# Patient Record
Sex: Female | Born: 1940 | Race: White | Hispanic: No | Marital: Married | Smoking: Never smoker
Health system: Southern US, Community
[De-identification: ages and names within clinical notes are randomized; demographics above are authoritative.]

## PROBLEM LIST (undated history)

## (undated) DIAGNOSIS — E785 Hyperlipidemia, unspecified: Secondary | ICD-10-CM

## (undated) DIAGNOSIS — H539 Unspecified visual disturbance: Secondary | ICD-10-CM

## (undated) DIAGNOSIS — I10 Essential (primary) hypertension: Secondary | ICD-10-CM

## (undated) HISTORY — PX: MASTECTOMY: SHX3

## (undated) HISTORY — PX: MENISCUS REPAIR: SHX5179

## (undated) HISTORY — DX: Hyperlipidemia, unspecified: E78.5

## (undated) HISTORY — DX: Essential (primary) hypertension: I10

## (undated) HISTORY — PX: PARTIAL HYSTERECTOMY: SHX80

## (undated) HISTORY — DX: Unspecified visual disturbance: H53.9

---

## 1998-07-26 ENCOUNTER — Emergency Department (HOSPITAL_COMMUNITY): Admission: EM | Admit: 1998-07-26 | Discharge: 1998-07-26 | Payer: Self-pay | Admitting: Emergency Medicine

## 1998-08-16 ENCOUNTER — Encounter: Payer: Self-pay | Admitting: Internal Medicine

## 1998-08-16 ENCOUNTER — Ambulatory Visit (HOSPITAL_COMMUNITY): Admission: RE | Admit: 1998-08-16 | Discharge: 1998-08-16 | Payer: Self-pay | Admitting: Internal Medicine

## 1999-09-28 ENCOUNTER — Ambulatory Visit (HOSPITAL_COMMUNITY): Admission: RE | Admit: 1999-09-28 | Discharge: 1999-09-28 | Payer: Self-pay | Admitting: *Deleted

## 1999-09-28 ENCOUNTER — Encounter (INDEPENDENT_AMBULATORY_CARE_PROVIDER_SITE_OTHER): Payer: Self-pay | Admitting: Specialist

## 2002-01-08 ENCOUNTER — Encounter: Admission: RE | Admit: 2002-01-08 | Discharge: 2002-01-08 | Payer: Self-pay | Admitting: Internal Medicine

## 2002-01-08 ENCOUNTER — Encounter: Payer: Self-pay | Admitting: Internal Medicine

## 2003-03-06 ENCOUNTER — Encounter: Admission: RE | Admit: 2003-03-06 | Discharge: 2003-03-06 | Payer: Self-pay | Admitting: Internal Medicine

## 2003-03-06 ENCOUNTER — Encounter: Payer: Self-pay | Admitting: Internal Medicine

## 2006-12-25 ENCOUNTER — Encounter: Admission: RE | Admit: 2006-12-25 | Discharge: 2006-12-25 | Payer: Self-pay | Admitting: Internal Medicine

## 2007-03-20 ENCOUNTER — Encounter (INDEPENDENT_AMBULATORY_CARE_PROVIDER_SITE_OTHER): Payer: Self-pay | Admitting: Gastroenterology

## 2007-03-20 ENCOUNTER — Ambulatory Visit (HOSPITAL_COMMUNITY): Admission: RE | Admit: 2007-03-20 | Discharge: 2007-03-20 | Payer: Self-pay | Admitting: Gastroenterology

## 2011-01-03 NOTE — Op Note (Signed)
NAMEMONICE, LUNDY                ACCOUNT NO.:  [id number]   MEDICAL RECORD NO.:  [id number]          PATIENT TYPE:  AMB   LOCATION:  ENDO                         FACILITY:  [facility]   PHYSICIAN:  Anselmo Rod, M.D.  DATE OF BIRTH:  1941-05-29   DATE OF PROCEDURE:  03/20/2007  DATE OF DISCHARGE:                               OPERATIVE REPORT   PROCEDURE PERFORMED:  Esophagogastroduodenoscopy with multiple cold  biopsies.   ENDOSCOPIST:  Anselmo Rod, M.D.   INSTRUMENT USED:  Pentax video panendoscope.   INDICATIONS FOR PROCEDURE:  70 year old white female with a history of  abnormal gas and bloating which has since resolved undergoing EGD for  abnormal CT scan of the abdomen showing antral thickening, rule out  ulcer disease versus masses, etc.   PREPROCEDURE PREPARATION:  Informed consent was procured from the  patient.  The patient fasted for 8 hours prior to procedure.  The risks  and benefits of the procedure were discussed with the patient in detail.   PREPROCEDURE PHYSICAL:  The patient had stable vital signs.  Neck  supple.  Chest clear to auscultation.  S1 and S2 regular.  Abdomen soft  with normal bowel sounds.   DESCRIPTION OF PROCEDURE:  The patient was placed in the left lateral  decubitus position, sedated with 75 mcg of Fentanyl and 6 mg of Versed  given intravenously in slow incremental doses. Once the patient was  adequately sedated and maintained on low flow oxygen and continuous  cardiac monitoring, the Pentax video panendoscope was advanced through  the mouthpiece over the tongue into the esophagus under direct vision.  The esophagus was widely patent with no evidence of ring, stricture,  mass, esophagitis or Barrett's mucosa. The scope was then advanced into  the stomach.  Patchy gastritis with old heme was noted in the stomach.  Biopsies were done to rule out the presence of H. pylori by pathology. A  small sessile polyp was seen in th proximal stomach  and was biopsied x 1  for pathology. The proximal small bowel appeared normal. There was no  obstruction. The patient tolerated the procedure well without  complications.   IMPRESSION:  1. Normal appearing esophagus.  2. Patchy gastritis with old heme in the stomach, biopsies done for H.      pylori.  3. No evidence of a hiatal hernia on high retroflexion.  4. Normal appearing antrum except for mild gastritis.  5. Normal proximal small bowel.  6. One small sessile polyp in proximal stomach [biopsied x 1]   RECOMMENDATIONS:  1. Await pathology results.  2. Avoid nonsteroidals.  3. PPI of choice.  4. Outpatient follow-up in the next two weeks for further      recommendations.      Anselmo Rod, M.D.  Electronically Signed     JNM/MEDQ  D:  03/20/2007  T:  03/20/2007  Job:  [id number]   cc:   Georgianne Fick, M.D.  Fax: [phone]

## 2012-02-16 ENCOUNTER — Other Ambulatory Visit: Payer: Self-pay | Admitting: Internal Medicine

## 2012-02-26 ENCOUNTER — Ambulatory Visit
Admission: RE | Admit: 2012-02-26 | Discharge: 2012-02-26 | Disposition: A | Discharge status: Home / Self Care | Payer: Medicare Other | Source: Ambulatory Visit | Attending: Internal Medicine | Admitting: Internal Medicine

## 2014-11-02 DIAGNOSIS — M549 Dorsalgia, unspecified: Secondary | ICD-10-CM | POA: Diagnosis not present

## 2014-11-02 DIAGNOSIS — E782 Mixed hyperlipidemia: Secondary | ICD-10-CM | POA: Diagnosis not present

## 2014-11-02 DIAGNOSIS — I10 Essential (primary) hypertension: Secondary | ICD-10-CM | POA: Diagnosis not present

## 2014-11-02 DIAGNOSIS — Z0000 Encounter for general adult medical examination without abnormal findings: Secondary | ICD-10-CM | POA: Diagnosis not present

## 2014-11-02 DIAGNOSIS — Z1389 Encounter for screening for other disorder: Secondary | ICD-10-CM | POA: Diagnosis not present

## 2014-11-09 DIAGNOSIS — I10 Essential (primary) hypertension: Secondary | ICD-10-CM | POA: Diagnosis not present

## 2014-11-09 DIAGNOSIS — M79671 Pain in right foot: Secondary | ICD-10-CM | POA: Diagnosis not present

## 2014-11-09 DIAGNOSIS — Z0000 Encounter for general adult medical examination without abnormal findings: Secondary | ICD-10-CM | POA: Diagnosis not present

## 2014-11-09 DIAGNOSIS — E782 Mixed hyperlipidemia: Secondary | ICD-10-CM | POA: Diagnosis not present

## 2014-11-17 DIAGNOSIS — M76821 Posterior tibial tendinitis, right leg: Secondary | ICD-10-CM | POA: Diagnosis not present

## 2014-11-17 DIAGNOSIS — M2041 Other hammer toe(s) (acquired), right foot: Secondary | ICD-10-CM | POA: Diagnosis not present

## 2014-11-17 DIAGNOSIS — M2042 Other hammer toe(s) (acquired), left foot: Secondary | ICD-10-CM | POA: Diagnosis not present

## 2014-12-17 DIAGNOSIS — M25571 Pain in right ankle and joints of right foot: Secondary | ICD-10-CM | POA: Diagnosis not present

## 2014-12-17 DIAGNOSIS — M76821 Posterior tibial tendinitis, right leg: Secondary | ICD-10-CM | POA: Diagnosis not present

## 2014-12-17 DIAGNOSIS — M722 Plantar fascial fibromatosis: Secondary | ICD-10-CM | POA: Diagnosis not present

## 2014-12-31 DIAGNOSIS — M722 Plantar fascial fibromatosis: Secondary | ICD-10-CM | POA: Diagnosis not present

## 2014-12-31 DIAGNOSIS — M76821 Posterior tibial tendinitis, right leg: Secondary | ICD-10-CM | POA: Diagnosis not present

## 2015-01-07 DIAGNOSIS — Z803 Family history of malignant neoplasm of breast: Secondary | ICD-10-CM | POA: Diagnosis not present

## 2015-01-07 DIAGNOSIS — Z1231 Encounter for screening mammogram for malignant neoplasm of breast: Secondary | ICD-10-CM | POA: Diagnosis not present

## 2015-01-25 DIAGNOSIS — R921 Mammographic calcification found on diagnostic imaging of breast: Secondary | ICD-10-CM | POA: Diagnosis not present

## 2015-01-28 DIAGNOSIS — Z803 Family history of malignant neoplasm of breast: Secondary | ICD-10-CM | POA: Diagnosis not present

## 2015-01-28 DIAGNOSIS — R920 Mammographic microcalcification found on diagnostic imaging of breast: Secondary | ICD-10-CM | POA: Diagnosis not present

## 2015-03-24 ENCOUNTER — Ambulatory Visit
Admission: RE | Admit: 2015-03-24 | Discharge: 2015-03-24 | Disposition: A | Discharge status: Home / Self Care | Payer: Medicare Other | Source: Ambulatory Visit | Attending: Internal Medicine | Admitting: Internal Medicine

## 2015-03-24 ENCOUNTER — Other Ambulatory Visit: Payer: Self-pay | Admitting: Internal Medicine

## 2015-03-24 DIAGNOSIS — R5383 Other fatigue: Secondary | ICD-10-CM | POA: Diagnosis not present

## 2015-03-24 DIAGNOSIS — R0602 Shortness of breath: Secondary | ICD-10-CM | POA: Diagnosis not present

## 2015-03-24 DIAGNOSIS — H539 Unspecified visual disturbance: Secondary | ICD-10-CM | POA: Diagnosis not present

## 2015-03-24 DIAGNOSIS — H8109 Meniere's disease, unspecified ear: Secondary | ICD-10-CM | POA: Diagnosis not present

## 2015-03-24 DIAGNOSIS — H53123 Transient visual loss, bilateral: Secondary | ICD-10-CM | POA: Diagnosis not present

## 2015-03-24 DIAGNOSIS — H538 Other visual disturbances: Secondary | ICD-10-CM | POA: Diagnosis not present

## 2015-03-25 ENCOUNTER — Other Ambulatory Visit: Payer: Self-pay | Admitting: Internal Medicine

## 2015-03-26 ENCOUNTER — Other Ambulatory Visit: Payer: Self-pay

## 2015-04-07 DIAGNOSIS — I10 Essential (primary) hypertension: Secondary | ICD-10-CM | POA: Diagnosis not present

## 2015-04-07 DIAGNOSIS — E782 Mixed hyperlipidemia: Secondary | ICD-10-CM | POA: Diagnosis not present

## 2015-04-07 DIAGNOSIS — H53123 Transient visual loss, bilateral: Secondary | ICD-10-CM | POA: Diagnosis not present

## 2015-04-13 DIAGNOSIS — R002 Palpitations: Secondary | ICD-10-CM | POA: Diagnosis not present

## 2015-04-13 DIAGNOSIS — R0602 Shortness of breath: Secondary | ICD-10-CM | POA: Diagnosis not present

## 2015-04-27 DIAGNOSIS — I10 Essential (primary) hypertension: Secondary | ICD-10-CM | POA: Diagnosis not present

## 2015-04-27 DIAGNOSIS — E782 Mixed hyperlipidemia: Secondary | ICD-10-CM | POA: Diagnosis not present

## 2015-04-27 DIAGNOSIS — E785 Hyperlipidemia, unspecified: Secondary | ICD-10-CM | POA: Diagnosis not present

## 2015-04-27 DIAGNOSIS — M5137 Other intervertebral disc degeneration, lumbosacral region: Secondary | ICD-10-CM | POA: Diagnosis not present

## 2015-05-04 DIAGNOSIS — I341 Nonrheumatic mitral (valve) prolapse: Secondary | ICD-10-CM | POA: Diagnosis not present

## 2015-05-04 DIAGNOSIS — R600 Localized edema: Secondary | ICD-10-CM | POA: Diagnosis not present

## 2015-05-04 DIAGNOSIS — E782 Mixed hyperlipidemia: Secondary | ICD-10-CM | POA: Diagnosis not present

## 2015-05-04 DIAGNOSIS — Z23 Encounter for immunization: Secondary | ICD-10-CM | POA: Diagnosis not present

## 2015-05-04 DIAGNOSIS — R42 Dizziness and giddiness: Secondary | ICD-10-CM | POA: Diagnosis not present

## 2015-06-25 DIAGNOSIS — J0100 Acute maxillary sinusitis, unspecified: Secondary | ICD-10-CM | POA: Diagnosis not present

## 2015-10-07 DIAGNOSIS — N819 Female genital prolapse, unspecified: Secondary | ICD-10-CM | POA: Diagnosis not present

## 2015-10-11 DIAGNOSIS — N812 Incomplete uterovaginal prolapse: Secondary | ICD-10-CM | POA: Diagnosis not present

## 2015-10-22 DIAGNOSIS — N812 Incomplete uterovaginal prolapse: Secondary | ICD-10-CM | POA: Diagnosis not present

## 2015-10-22 DIAGNOSIS — N83292 Other ovarian cyst, left side: Secondary | ICD-10-CM | POA: Diagnosis not present

## 2015-10-22 DIAGNOSIS — N83202 Unspecified ovarian cyst, left side: Secondary | ICD-10-CM | POA: Diagnosis not present

## 2015-11-17 DIAGNOSIS — E782 Mixed hyperlipidemia: Secondary | ICD-10-CM | POA: Diagnosis not present

## 2015-11-17 DIAGNOSIS — I10 Essential (primary) hypertension: Secondary | ICD-10-CM | POA: Diagnosis not present

## 2015-11-17 DIAGNOSIS — R5382 Chronic fatigue, unspecified: Secondary | ICD-10-CM | POA: Diagnosis not present

## 2015-11-17 DIAGNOSIS — I341 Nonrheumatic mitral (valve) prolapse: Secondary | ICD-10-CM | POA: Diagnosis not present

## 2015-11-17 DIAGNOSIS — Z0000 Encounter for general adult medical examination without abnormal findings: Secondary | ICD-10-CM | POA: Diagnosis not present

## 2015-11-23 DIAGNOSIS — I10 Essential (primary) hypertension: Secondary | ICD-10-CM | POA: Diagnosis not present

## 2015-11-23 DIAGNOSIS — H8109 Meniere's disease, unspecified ear: Secondary | ICD-10-CM | POA: Diagnosis not present

## 2015-11-23 DIAGNOSIS — E782 Mixed hyperlipidemia: Secondary | ICD-10-CM | POA: Diagnosis not present

## 2015-11-23 DIAGNOSIS — R600 Localized edema: Secondary | ICD-10-CM | POA: Diagnosis not present

## 2015-12-13 DIAGNOSIS — Z961 Presence of intraocular lens: Secondary | ICD-10-CM | POA: Diagnosis not present

## 2015-12-13 DIAGNOSIS — H04123 Dry eye syndrome of bilateral lacrimal glands: Secondary | ICD-10-CM | POA: Diagnosis not present

## 2015-12-13 DIAGNOSIS — H43393 Other vitreous opacities, bilateral: Secondary | ICD-10-CM | POA: Diagnosis not present

## 2016-01-31 DIAGNOSIS — N898 Other specified noninflammatory disorders of vagina: Secondary | ICD-10-CM | POA: Diagnosis not present

## 2016-01-31 DIAGNOSIS — N83209 Unspecified ovarian cyst, unspecified side: Secondary | ICD-10-CM | POA: Diagnosis not present

## 2016-01-31 DIAGNOSIS — Z960 Presence of urogenital implants: Secondary | ICD-10-CM | POA: Diagnosis not present

## 2016-02-14 ENCOUNTER — Other Ambulatory Visit: Payer: Self-pay

## 2016-02-14 DIAGNOSIS — L570 Actinic keratosis: Secondary | ICD-10-CM | POA: Diagnosis not present

## 2016-02-14 DIAGNOSIS — C44519 Basal cell carcinoma of skin of other part of trunk: Secondary | ICD-10-CM | POA: Diagnosis not present

## 2016-02-14 DIAGNOSIS — C44511 Basal cell carcinoma of skin of breast: Secondary | ICD-10-CM | POA: Diagnosis not present

## 2016-02-14 DIAGNOSIS — D485 Neoplasm of uncertain behavior of skin: Secondary | ICD-10-CM | POA: Diagnosis not present

## 2016-02-14 DIAGNOSIS — L739 Follicular disorder, unspecified: Secondary | ICD-10-CM | POA: Diagnosis not present

## 2016-02-21 DIAGNOSIS — J329 Chronic sinusitis, unspecified: Secondary | ICD-10-CM | POA: Diagnosis not present

## 2016-04-13 DIAGNOSIS — C44519 Basal cell carcinoma of skin of other part of trunk: Secondary | ICD-10-CM | POA: Diagnosis not present

## 2016-05-31 DIAGNOSIS — E782 Mixed hyperlipidemia: Secondary | ICD-10-CM | POA: Diagnosis not present

## 2016-05-31 DIAGNOSIS — R600 Localized edema: Secondary | ICD-10-CM | POA: Diagnosis not present

## 2016-05-31 DIAGNOSIS — I10 Essential (primary) hypertension: Secondary | ICD-10-CM | POA: Diagnosis not present

## 2016-05-31 DIAGNOSIS — Z4689 Encounter for fitting and adjustment of other specified devices: Secondary | ICD-10-CM | POA: Diagnosis not present

## 2016-06-07 DIAGNOSIS — E782 Mixed hyperlipidemia: Secondary | ICD-10-CM | POA: Diagnosis not present

## 2016-06-07 DIAGNOSIS — I10 Essential (primary) hypertension: Secondary | ICD-10-CM | POA: Diagnosis not present

## 2016-06-07 DIAGNOSIS — M545 Low back pain: Secondary | ICD-10-CM | POA: Diagnosis not present

## 2016-06-07 DIAGNOSIS — H8109 Meniere's disease, unspecified ear: Secondary | ICD-10-CM | POA: Diagnosis not present

## 2016-06-07 DIAGNOSIS — Z23 Encounter for immunization: Secondary | ICD-10-CM | POA: Diagnosis not present

## 2016-09-11 DIAGNOSIS — N8110 Cystocele, unspecified: Secondary | ICD-10-CM | POA: Diagnosis not present

## 2016-09-11 DIAGNOSIS — E782 Mixed hyperlipidemia: Secondary | ICD-10-CM | POA: Diagnosis not present

## 2016-09-14 DIAGNOSIS — E782 Mixed hyperlipidemia: Secondary | ICD-10-CM | POA: Diagnosis not present

## 2016-09-14 DIAGNOSIS — I10 Essential (primary) hypertension: Secondary | ICD-10-CM | POA: Diagnosis not present

## 2016-09-14 DIAGNOSIS — R0602 Shortness of breath: Secondary | ICD-10-CM | POA: Diagnosis not present

## 2016-09-14 DIAGNOSIS — R072 Precordial pain: Secondary | ICD-10-CM | POA: Diagnosis not present

## 2016-09-20 DIAGNOSIS — R0609 Other forms of dyspnea: Secondary | ICD-10-CM | POA: Diagnosis not present

## 2016-09-20 DIAGNOSIS — R0989 Other specified symptoms and signs involving the circulatory and respiratory systems: Secondary | ICD-10-CM | POA: Diagnosis not present

## 2016-09-20 DIAGNOSIS — E782 Mixed hyperlipidemia: Secondary | ICD-10-CM | POA: Diagnosis not present

## 2016-09-20 DIAGNOSIS — R0789 Other chest pain: Secondary | ICD-10-CM | POA: Diagnosis not present

## 2016-09-25 DIAGNOSIS — I10 Essential (primary) hypertension: Secondary | ICD-10-CM | POA: Diagnosis not present

## 2016-09-25 DIAGNOSIS — R0789 Other chest pain: Secondary | ICD-10-CM | POA: Diagnosis not present

## 2016-09-25 DIAGNOSIS — R0602 Shortness of breath: Secondary | ICD-10-CM | POA: Diagnosis not present

## 2016-09-27 DIAGNOSIS — R0989 Other specified symptoms and signs involving the circulatory and respiratory systems: Secondary | ICD-10-CM | POA: Diagnosis not present

## 2016-09-27 DIAGNOSIS — R0602 Shortness of breath: Secondary | ICD-10-CM | POA: Diagnosis not present

## 2016-09-27 DIAGNOSIS — R0789 Other chest pain: Secondary | ICD-10-CM | POA: Diagnosis not present

## 2016-10-11 DIAGNOSIS — R0609 Other forms of dyspnea: Secondary | ICD-10-CM | POA: Diagnosis not present

## 2016-10-11 DIAGNOSIS — E782 Mixed hyperlipidemia: Secondary | ICD-10-CM | POA: Diagnosis not present

## 2016-10-11 DIAGNOSIS — R0789 Other chest pain: Secondary | ICD-10-CM | POA: Diagnosis not present

## 2016-10-11 DIAGNOSIS — I341 Nonrheumatic mitral (valve) prolapse: Secondary | ICD-10-CM | POA: Diagnosis not present

## 2016-10-27 DIAGNOSIS — M85671 Other cyst of bone, right ankle and foot: Secondary | ICD-10-CM | POA: Diagnosis not present

## 2016-10-27 DIAGNOSIS — M79671 Pain in right foot: Secondary | ICD-10-CM | POA: Diagnosis not present

## 2016-10-30 ENCOUNTER — Other Ambulatory Visit: Payer: Self-pay | Admitting: Podiatry

## 2016-10-30 DIAGNOSIS — M8560 Other cyst of bone, unspecified site: Secondary | ICD-10-CM

## 2016-11-05 ENCOUNTER — Ambulatory Visit
Admission: RE | Admit: 2016-11-05 | Discharge: 2016-11-05 | Disposition: A | Discharge status: Home / Self Care | Payer: Medicare Other | Source: Ambulatory Visit | Attending: Podiatry | Admitting: Podiatry

## 2016-11-05 DIAGNOSIS — M7989 Other specified soft tissue disorders: Secondary | ICD-10-CM | POA: Diagnosis not present

## 2016-11-05 DIAGNOSIS — M8560 Other cyst of bone, unspecified site: Secondary | ICD-10-CM

## 2016-11-05 MED ORDER — GADOBENATE DIMEGLUMINE 529 MG/ML IV SOLN
15.0000 mL | Freq: Once | INTRAVENOUS | Status: AC | PRN
  Administered 2016-11-05: 15 mL via INTRAVENOUS

## 2016-11-09 DIAGNOSIS — I10 Essential (primary) hypertension: Secondary | ICD-10-CM | POA: Diagnosis not present

## 2016-11-09 DIAGNOSIS — E782 Mixed hyperlipidemia: Secondary | ICD-10-CM | POA: Diagnosis not present

## 2016-11-13 DIAGNOSIS — M25571 Pain in right ankle and joints of right foot: Secondary | ICD-10-CM | POA: Diagnosis not present

## 2016-11-13 DIAGNOSIS — M79671 Pain in right foot: Secondary | ICD-10-CM | POA: Diagnosis not present

## 2016-11-13 DIAGNOSIS — M85671 Other cyst of bone, right ankle and foot: Secondary | ICD-10-CM | POA: Diagnosis not present

## 2016-11-16 DIAGNOSIS — E782 Mixed hyperlipidemia: Secondary | ICD-10-CM | POA: Diagnosis not present

## 2016-11-16 DIAGNOSIS — I10 Essential (primary) hypertension: Secondary | ICD-10-CM | POA: Diagnosis not present

## 2016-12-19 DIAGNOSIS — M85671 Other cyst of bone, right ankle and foot: Secondary | ICD-10-CM | POA: Diagnosis not present

## 2016-12-19 DIAGNOSIS — M79671 Pain in right foot: Secondary | ICD-10-CM | POA: Diagnosis not present

## 2016-12-25 DIAGNOSIS — H903 Sensorineural hearing loss, bilateral: Secondary | ICD-10-CM | POA: Diagnosis not present

## 2016-12-25 DIAGNOSIS — H9113 Presbycusis, bilateral: Secondary | ICD-10-CM | POA: Diagnosis not present

## 2016-12-25 DIAGNOSIS — R42 Dizziness and giddiness: Secondary | ICD-10-CM | POA: Diagnosis not present

## 2016-12-25 DIAGNOSIS — H8149 Vertigo of central origin, unspecified ear: Secondary | ICD-10-CM | POA: Diagnosis not present

## 2017-01-09 DIAGNOSIS — M79671 Pain in right foot: Secondary | ICD-10-CM | POA: Diagnosis not present

## 2017-01-09 DIAGNOSIS — M85671 Other cyst of bone, right ankle and foot: Secondary | ICD-10-CM | POA: Diagnosis not present

## 2017-03-12 DIAGNOSIS — Z0000 Encounter for general adult medical examination without abnormal findings: Secondary | ICD-10-CM | POA: Diagnosis not present

## 2017-03-12 DIAGNOSIS — I10 Essential (primary) hypertension: Secondary | ICD-10-CM | POA: Diagnosis not present

## 2017-03-12 DIAGNOSIS — E782 Mixed hyperlipidemia: Secondary | ICD-10-CM | POA: Diagnosis not present

## 2017-03-12 DIAGNOSIS — Z780 Asymptomatic menopausal state: Secondary | ICD-10-CM | POA: Diagnosis not present

## 2017-03-19 DIAGNOSIS — I10 Essential (primary) hypertension: Secondary | ICD-10-CM | POA: Diagnosis not present

## 2017-03-19 DIAGNOSIS — E782 Mixed hyperlipidemia: Secondary | ICD-10-CM | POA: Diagnosis not present

## 2017-03-19 DIAGNOSIS — L03119 Cellulitis of unspecified part of limb: Secondary | ICD-10-CM | POA: Diagnosis not present

## 2017-03-19 DIAGNOSIS — R600 Localized edema: Secondary | ICD-10-CM | POA: Diagnosis not present

## 2017-03-19 DIAGNOSIS — Z23 Encounter for immunization: Secondary | ICD-10-CM | POA: Diagnosis not present

## 2017-03-29 DIAGNOSIS — R21 Rash and other nonspecific skin eruption: Secondary | ICD-10-CM | POA: Diagnosis not present

## 2017-03-30 DIAGNOSIS — L989 Disorder of the skin and subcutaneous tissue, unspecified: Secondary | ICD-10-CM | POA: Diagnosis not present

## 2017-03-30 DIAGNOSIS — L309 Dermatitis, unspecified: Secondary | ICD-10-CM | POA: Diagnosis not present

## 2017-04-19 DIAGNOSIS — H52203 Unspecified astigmatism, bilateral: Secondary | ICD-10-CM | POA: Diagnosis not present

## 2017-04-19 DIAGNOSIS — H02831 Dermatochalasis of right upper eyelid: Secondary | ICD-10-CM | POA: Diagnosis not present

## 2017-04-19 DIAGNOSIS — H04123 Dry eye syndrome of bilateral lacrimal glands: Secondary | ICD-10-CM | POA: Diagnosis not present

## 2017-04-19 DIAGNOSIS — H26492 Other secondary cataract, left eye: Secondary | ICD-10-CM | POA: Diagnosis not present

## 2017-05-01 DIAGNOSIS — L03115 Cellulitis of right lower limb: Secondary | ICD-10-CM | POA: Diagnosis not present

## 2017-06-05 DIAGNOSIS — N390 Urinary tract infection, site not specified: Secondary | ICD-10-CM | POA: Diagnosis not present

## 2017-06-07 DIAGNOSIS — Z800 Family history of malignant neoplasm of digestive organs: Secondary | ICD-10-CM | POA: Diagnosis not present

## 2017-06-07 DIAGNOSIS — Z1211 Encounter for screening for malignant neoplasm of colon: Secondary | ICD-10-CM | POA: Diagnosis not present

## 2017-06-11 DIAGNOSIS — K633 Ulcer of intestine: Secondary | ICD-10-CM | POA: Diagnosis not present

## 2017-06-11 DIAGNOSIS — Z800 Family history of malignant neoplasm of digestive organs: Secondary | ICD-10-CM | POA: Diagnosis not present

## 2017-06-11 DIAGNOSIS — K635 Polyp of colon: Secondary | ICD-10-CM | POA: Diagnosis not present

## 2017-06-11 DIAGNOSIS — Z1211 Encounter for screening for malignant neoplasm of colon: Secondary | ICD-10-CM | POA: Diagnosis not present

## 2017-06-25 DIAGNOSIS — Z1231 Encounter for screening mammogram for malignant neoplasm of breast: Secondary | ICD-10-CM | POA: Diagnosis not present

## 2017-06-25 DIAGNOSIS — Z803 Family history of malignant neoplasm of breast: Secondary | ICD-10-CM | POA: Diagnosis not present

## 2017-07-02 DIAGNOSIS — Z23 Encounter for immunization: Secondary | ICD-10-CM | POA: Diagnosis not present

## 2017-07-08 IMAGING — MR MR FOOT*R* WO/W CM
5 of 9 series · 22 of 40 positions shown · IV contrast (9ml multihance)
Comparison: None.

CLINICAL DATA: Pain and swelling in the right foot.

EXAM:
MRI OF THE RIGHT FOREFOOT WITHOUT AND WITH CONTRAST
TECHNIQUE: Multiplanar, multisequence MR imaging of the right forefoot was
performed before and after the administration of intravenous
contrast.
CONTRAST:  15mL MULTIHANCE GADOBENATE DIMEGLUMINE 529 MG/ML IV SOLN
Creatinine was obtained on site at [HOSPITAL] at [HOSPITAL].
Results: Creatinine 1.1 mg/dL.

[Series 3: T2 fat-sat · coronal · 4.0mm · 0.23mm/px · 5 of 27 slices shown (1 of 3)]
[im 1/27]
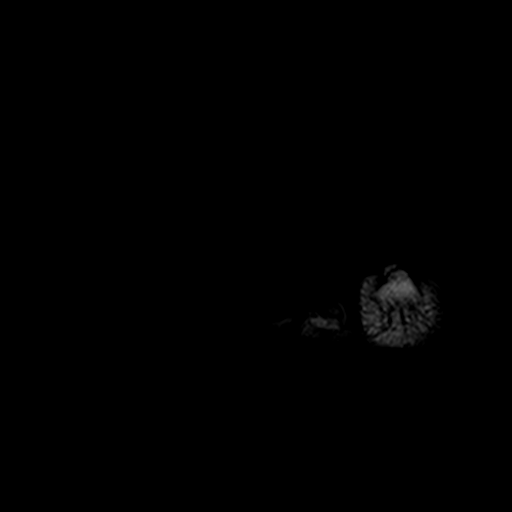
[im 7/27]
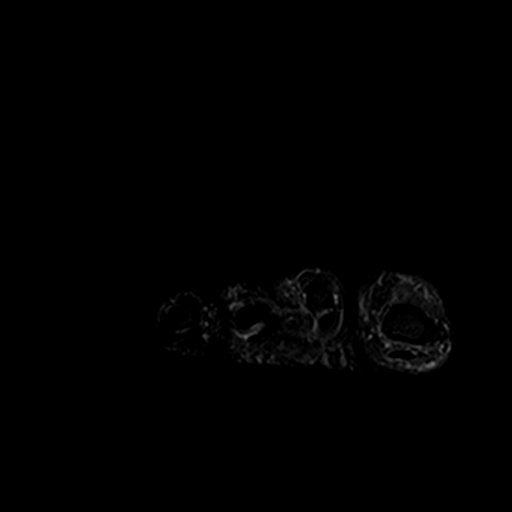
[im 14/27]
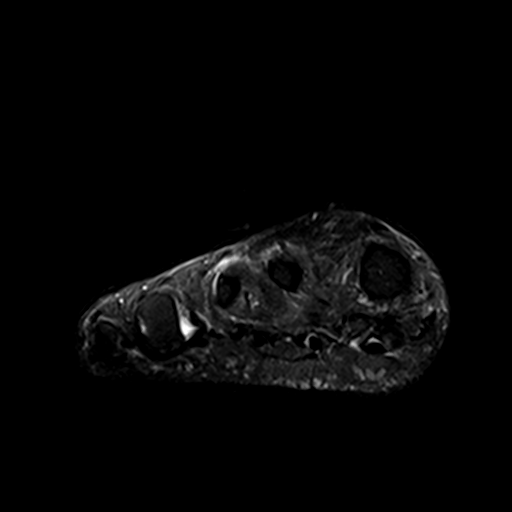
[im 20/27]
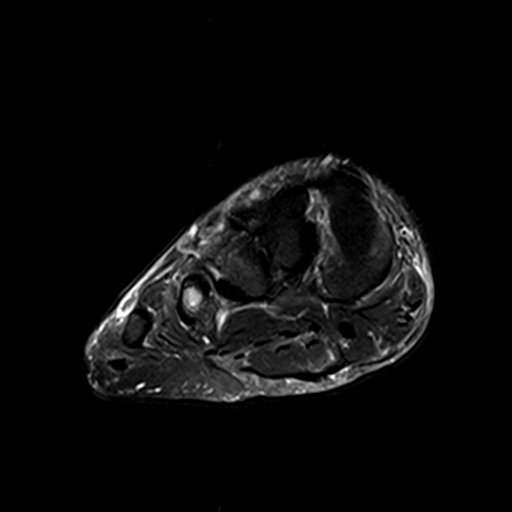
[im 27/27]
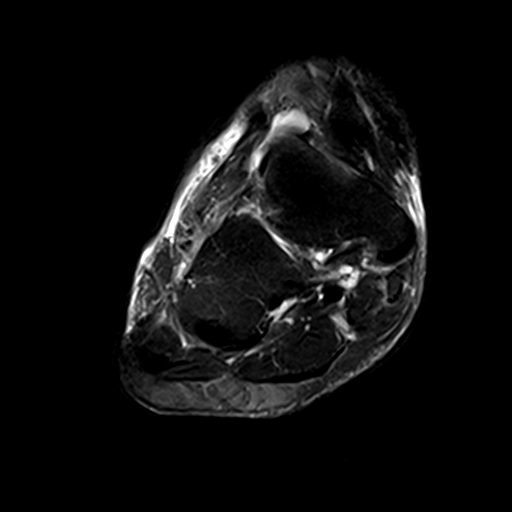

[Series 4: T1 · coronal · 4.0mm · 0.38mm/px · 5 of 27 slices shown]
[im 1/27]
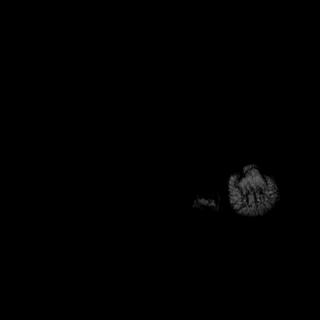
[im 7/27]
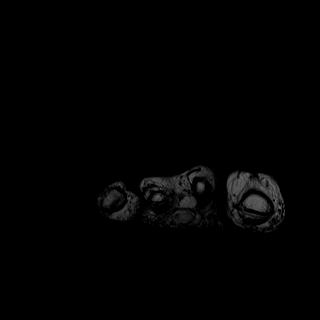
[im 14/27]
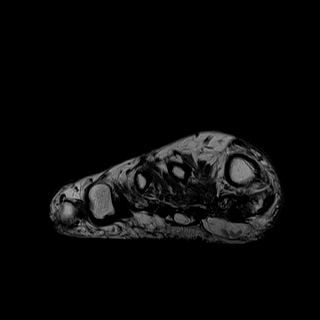
[im 20/27]
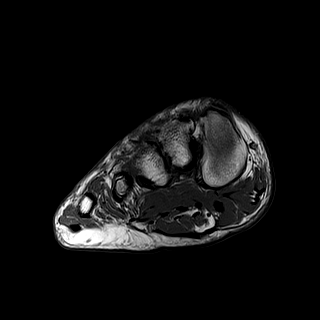
[im 27/27]
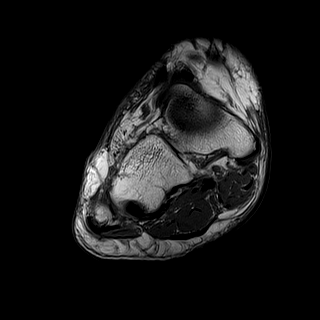

[Series 5: T1 fat-sat · coronal · 4.0mm · 0.47mm/px · 4 of 27 slices shown]
[im 1/27]
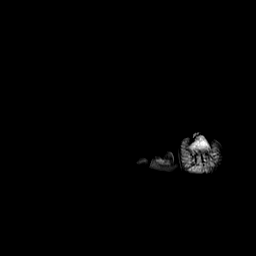
[im 7/27]
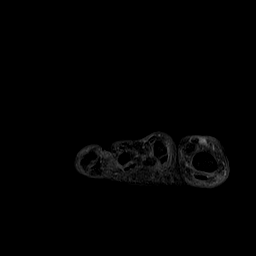
[im 14/27]
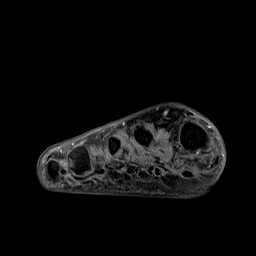
[im 20/27]
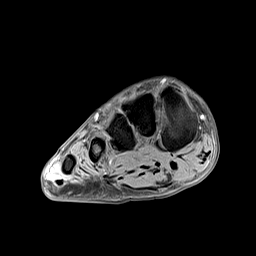

[Series 6: T2 fat-sat · axial · 2.5mm · 0.35mm/px · z∈[-93,-38]mm · 4 of 22 slices shown (2 of 3)]
[im 1/22]
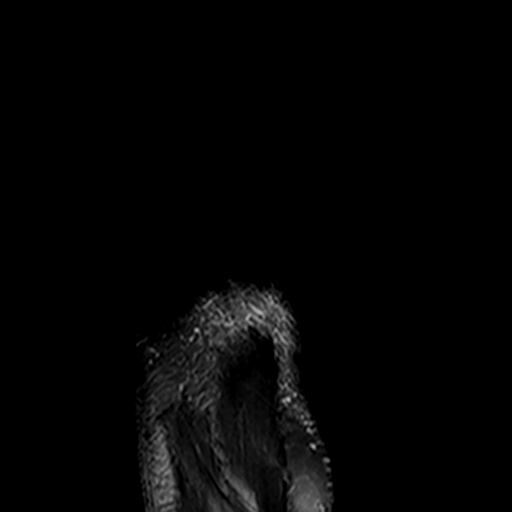
[im 8/22]
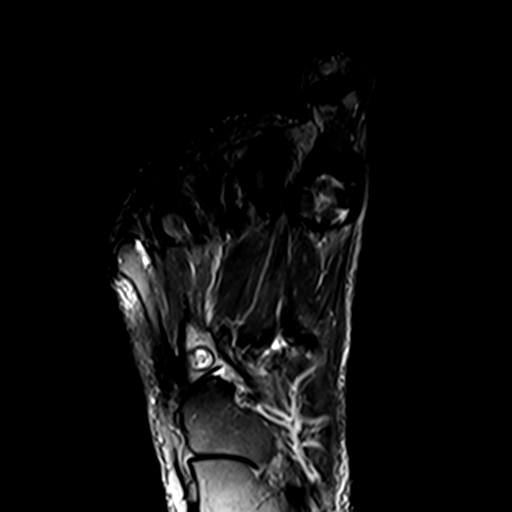
[im 15/22]
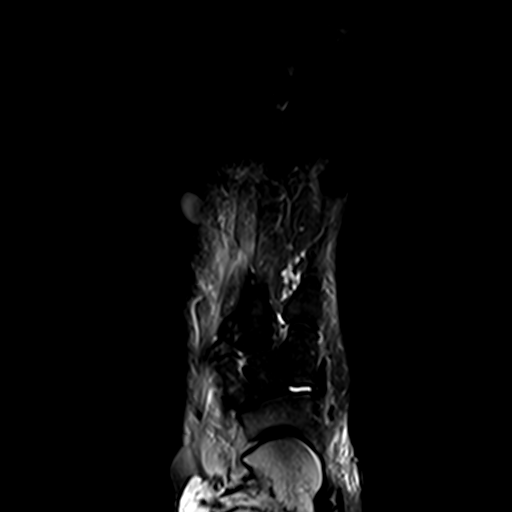
[im 22/22]
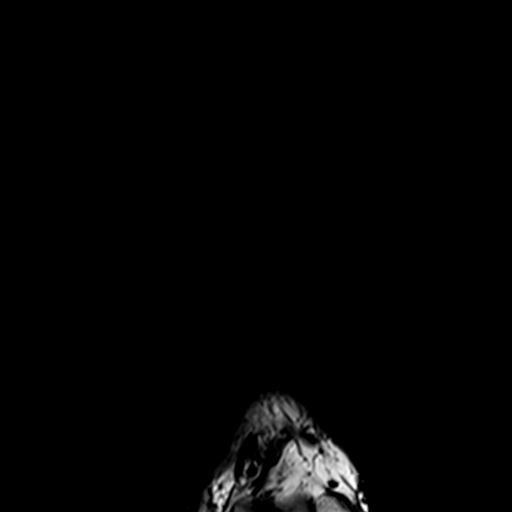

[Series 8: T2 fat-sat · sagittal · 3.0mm · 0.30mm/px · 4 of 25 slices shown (3 of 3)]
[im 1/25]
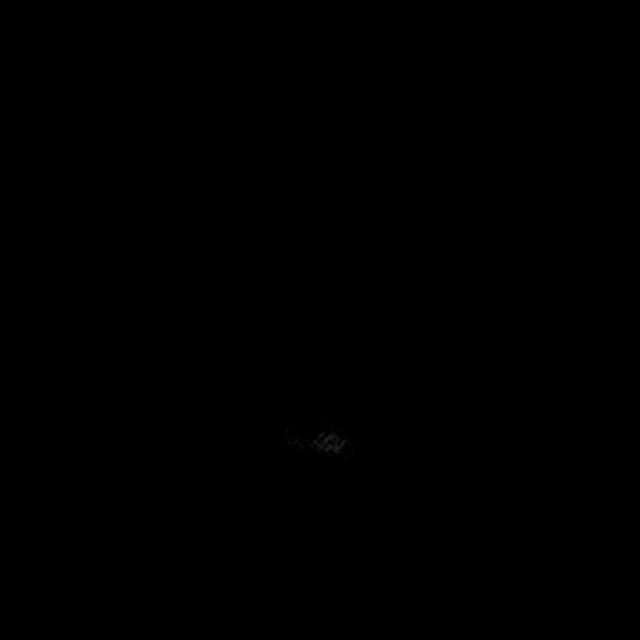
[im 9/25]
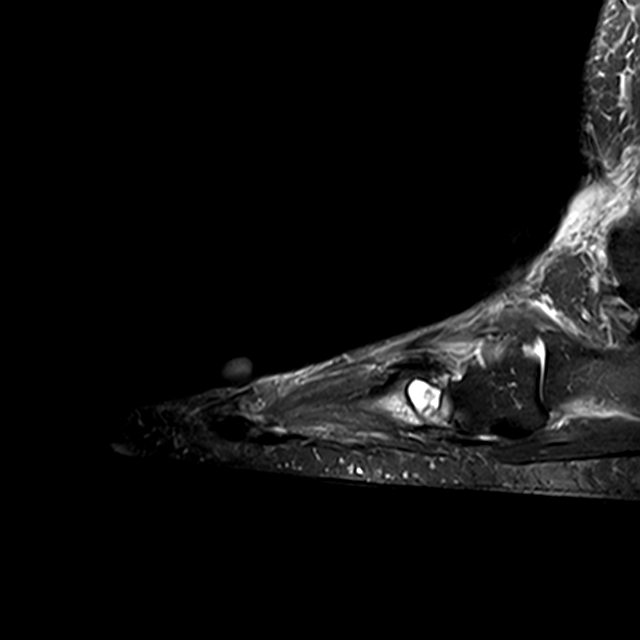
[im 17/25]
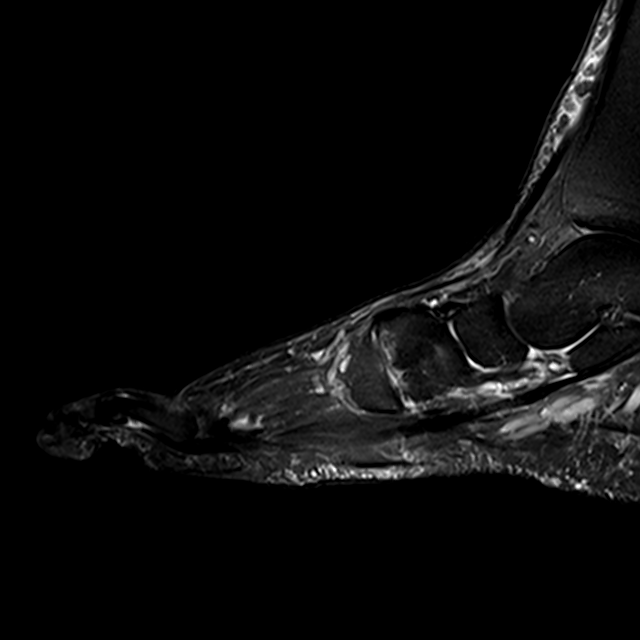
[im 25/25]
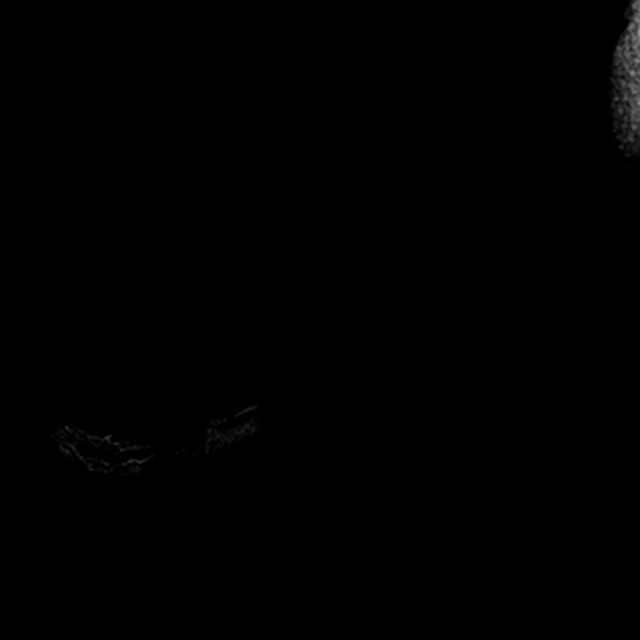

[22 of 40 positions shown; findings below may reference images not displayed]

FINDINGS: Bones/Joint/Cartilage

Large subcortical cystic lesions are present in the distal lateral
margin of the lateral cuneiform and in the base of the fourth
metatarsal, with associated cortical irregularity, arthropathy,
subcortical edema, and surrounding marrow enhancement. There is
degenerative arthropathy at the articulation of the lateral
cuneiform and the third metatarsal with low-level associated
subcortical edema and loss of articular space.

Mild spurring of the third and fifth fifth metatarsal heads. Mild
varus angulation at the second through fifth MTP joints.

Ligaments

The Lisfranc ligament is intact on image [DATE].

Muscles and Tendons

There seems to be diffuse fatty replacement of the extensor
digitorum brevis muscle based on the T1 weighted images such as
image [DATE]. Mild overlying subcutaneous edema.

Soft tissues

The marker indicating the location of the palpable mass is between
the fourth and fifth metatarsal heads. I do not see a discrete mass
at this location. However, there is some indistinctly reduced T1
signal between the third and fourth MTP joints for example on image
[DATE], which although not classic could conceivably represent a small
Morton' s neuroma.
IMPRESSION: 1. Arthropathy in particular between the lateral cuneiform and base
of the fourth metatarsal, as well as the base of the third
metatarsal, with subcortical cystic lesions in the base of the
fourth metatarsal and lateral cuneiform. These lesions could
represent geodes given the low-level enhancement. Erosions from gout
arthropathy are also possible. Degenerative subcortical cystic
lesions, wall possible, are somewhat less likely given the magnitude
of surrounding marrow enhancement particularly in the fourth
metatarsal base. I am skeptical of chronic infection.
2. The patient noted a palpable lump between the fourth and fifth
metatarsal heads. I do not see a mass in this vicinity. There is
some questionable reduced T1 signal in the adipose tissue between
the third and fourth metatarsals, which is probably incidental, and
less likely to represent a very small Morton's neuroma.
3. Fatty replacement of the extensor digitorum brevis muscle along
the forefoot.

## 2017-08-08 DIAGNOSIS — J069 Acute upper respiratory infection, unspecified: Secondary | ICD-10-CM | POA: Diagnosis not present

## 2017-10-04 DIAGNOSIS — J329 Chronic sinusitis, unspecified: Secondary | ICD-10-CM | POA: Diagnosis not present

## 2017-10-04 DIAGNOSIS — J3489 Other specified disorders of nose and nasal sinuses: Secondary | ICD-10-CM | POA: Diagnosis not present

## 2017-10-04 DIAGNOSIS — R05 Cough: Secondary | ICD-10-CM | POA: Diagnosis not present

## 2017-10-04 DIAGNOSIS — R0981 Nasal congestion: Secondary | ICD-10-CM | POA: Diagnosis not present

## 2017-10-04 DIAGNOSIS — H8109 Meniere's disease, unspecified ear: Secondary | ICD-10-CM | POA: Diagnosis not present

## 2017-10-04 DIAGNOSIS — I10 Essential (primary) hypertension: Secondary | ICD-10-CM | POA: Diagnosis not present

## 2017-10-04 DIAGNOSIS — E782 Mixed hyperlipidemia: Secondary | ICD-10-CM | POA: Diagnosis not present

## 2017-10-08 DIAGNOSIS — J0101 Acute recurrent maxillary sinusitis: Secondary | ICD-10-CM | POA: Diagnosis not present

## 2017-10-08 DIAGNOSIS — E782 Mixed hyperlipidemia: Secondary | ICD-10-CM | POA: Diagnosis not present

## 2017-10-08 DIAGNOSIS — I10 Essential (primary) hypertension: Secondary | ICD-10-CM | POA: Diagnosis not present

## 2017-10-11 DIAGNOSIS — R51 Headache: Secondary | ICD-10-CM | POA: Diagnosis not present

## 2017-10-11 DIAGNOSIS — R519 Headache, unspecified: Secondary | ICD-10-CM | POA: Insufficient documentation

## 2017-10-11 DIAGNOSIS — H53419 Scotoma involving central area, unspecified eye: Secondary | ICD-10-CM | POA: Diagnosis not present

## 2017-10-25 DIAGNOSIS — Z961 Presence of intraocular lens: Secondary | ICD-10-CM | POA: Diagnosis not present

## 2017-10-25 DIAGNOSIS — G43909 Migraine, unspecified, not intractable, without status migrainosus: Secondary | ICD-10-CM | POA: Diagnosis not present

## 2017-10-25 DIAGNOSIS — H5310 Unspecified subjective visual disturbances: Secondary | ICD-10-CM | POA: Diagnosis not present

## 2017-10-30 ENCOUNTER — Encounter: Payer: Self-pay | Admitting: Neurology

## 2017-10-31 ENCOUNTER — Other Ambulatory Visit: Payer: Self-pay

## 2017-10-31 ENCOUNTER — Ambulatory Visit (INDEPENDENT_AMBULATORY_CARE_PROVIDER_SITE_OTHER): Payer: PPO | Admitting: Neurology

## 2017-10-31 ENCOUNTER — Encounter: Payer: Self-pay | Admitting: Neurology

## 2017-10-31 VITALS — BP 161/65 | HR 77 | Resp 18 | Ht 63.0 in | Wt 170.0 lb

## 2017-10-31 DIAGNOSIS — G4489 Other headache syndrome: Secondary | ICD-10-CM

## 2017-10-31 DIAGNOSIS — G43109 Migraine with aura, not intractable, without status migrainosus: Secondary | ICD-10-CM

## 2017-10-31 DIAGNOSIS — H547 Unspecified visual loss: Secondary | ICD-10-CM | POA: Insufficient documentation

## 2017-10-31 DIAGNOSIS — H8109 Meniere's disease, unspecified ear: Secondary | ICD-10-CM

## 2017-10-31 DIAGNOSIS — G459 Transient cerebral ischemic attack, unspecified: Secondary | ICD-10-CM

## 2017-10-31 MED ORDER — METOPROLOL TARTRATE 25 MG PO TABS: 25.0000 mg | ORAL_TABLET | Freq: Two times a day (BID) | ORAL | 5 refills | Status: DC

## 2017-10-31 NOTE — Progress Notes (Signed)
[facility]  PATIENT: Andrea Owen DOB: [birth year]  REFERRING DOCTOR OR PCP:  Shon Hough (Ophthalmology); Dr. Ashby Dawes (PCP) SOURCE: Patient, telephone conversation with Dr. Kathrin Penner and notes, MRI reports and MRI images on PACS  _________________________________   HISTORICAL  CHIEF COMPLAINT:  Chief Complaint  Patient presents with  . Decreased Visual Acuity    Intermittent episodes of total loss of vision bilat, h/a, onset 10/10/17. Each time sx. have completely resolved.  Recent flu infection (twice)./fim    HISTORY OF PRESENT ILLNESS:  I had the pleasure of seeing your patient, Andrea Owen, at [facility] for neurologic consultation regarding her intermittent episodes of total visual loss and headache  Ms. Jasko is a 77 year old woman with a history of benign essential hypertension and hyperlipidemia     She had her first episode 10/10/2017.  She noted black spots in her vision and a few seconds later was unable to see anything including light.   This persisted about 1 hour and resolved.    During the episodes, she had no dizziness or lightheadedness.   She feels cognition was unchanged and she was able to talk and understand others.As the vision cleared, she had the onset of a headache that seemed bifrontal.  Pain was moderate.   There was no nausea.    There was no photophobia and phonophobia.  The second episode was a few days later and was similar except there was no headache.   The third episode was similar but shorter and the headache was milder,    She took ibuprofen but the headache still persisted the rest of the day but was resolved in the morning.        She seldom gets headaches..    She has had bilateral lens replacements for cataracts but has no other eye disorder.  She has no history of migraines.      She has hypertension and elevated cholesterol but has no cardiology issues.  She reports a normal for age stress  test and Echocardiogram.     She sees Dr. Einar Gip.     She has 3 brothers with cerebral aneurysm.    MRI/MRA in 2013 were normal for 77 age.   I personally reviewed the MRA from 2013 and the MRI from 2016. The MRA was normal. The left vertebral artery is dominant, a normal variant. The MRI shows some scattered T2/flair hyperintense foci predominantly in the subcortical and deep white matter. This is nonspecific and could be due to chronic microvascular ischemic change related to her age or to migraines.  She had the flu twice and was treated with Tamiflu.    She also had several rounds of antibiotics.      She has Meniere's disease and her last attack was 3 months with severe vertigo and poor balance.   These episodes can last days..  She gets nausea and vomiting  When she has a spell she takes meclizine and sleeps and is better when she wakes up.   She has mild bilateral hearing loss.      REVIEW OF SYSTEMS: Constitutional: No fevers, chills, sweats, or change in appetite Eyes: No visual changes, double vision, eye pain Ear, nose and throat: No hearing loss, ear pain, nasal congestion, sore throat Cardiovascular: No chest pain, palpitations Respiratory: No shortness of breath at rest or with exertion.   No wheezes GastrointestinaI: No nausea, vomiting, diarrhea, abdominal pain, fecal incontinence Genitourinary: No dysuria, urinary retention or frequency.  No nocturia. Musculoskeletal:  No neck pain, back pain Integumentary: No rash, pruritus, skin lesions Neurological: as above Psychiatric: No depression at this time.  No anxiety Endocrine: No palpitations, diaphoresis, change in appetite, change in weigh or increased thirst Hematologic/Lymphatic: No anemia, purpura, petechiae. Allergic/Immunologic: No itchy/runny eyes, nasal congestion, recent allergic reactions, rashes  ALLERGIES: Allergies  Allergen Reactions  . Codeine Rash    HOME MEDICATIONS:  Current Outpatient Medications:  .   losartan (COZAAR) 100 MG tablet, , Disp: , Rfl: 11 .  rosuvastatin (CRESTOR) 10 MG tablet, , Disp: , Rfl: 11 .  metoprolol tartrate (LOPRESSOR) 25 MG tablet, Take 1 tablet (25 mg total) by mouth 2 (two) times daily., Disp: 30 tablet, Rfl: 5  PAST MEDICAL HISTORY: Past Medical History:  Diagnosis Date  . Hypertension   . Vision abnormalities     PAST SURGICAL HISTORY:   FAMILY HISTORY: Family History  Problem Relation Age of Onset  . Colon cancer Mother   . Heart attack Father   . Congestive Heart Failure Brother   . Other Brother   . Deafness Brother   . Heart disease Brother   . Aneurysm Brother   . Aneurysm Brother     SOCIAL HISTORY:  Social History   Socioeconomic History  . Marital status: Married    Spouse name: Not on file  . Number of children: Not on file  . Years of education: Not on file  . Highest education level: Not on file  Social Needs  . Financial resource strain: Not on file  . Food insecurity - worry: Not on file  . Food insecurity - inability: Not on file  . Transportation needs - medical: Not on file  . Transportation needs - non-medical: Not on file  Occupational History  . Not on file  Tobacco Use  . Smoking status: Not on file  Substance and Sexual Activity  . Alcohol use: Not on file  . Drug use: Not on file  . Sexual activity: Not on file  Other Topics Concern  . Not on file  Social History Narrative  . Not on file     PHYSICAL EXAM  Vitals:   10/31/17 1326  BP: (!) 161/65  Pulse: 77  Resp: 18  Weight: 170 lb (77.1 kg)  Height: 5' 3" (1.6 m)    Body mass index is 30.11 kg/m.   General: The patient is well-developed and well-nourished and in no acute distress  Eyes:  Funduscopic exam shows normal optic discs and retinal vessels.  Neck: The neck is supple, no carotid bruits are noted.  The neck is nontender.  Cardiovascular: The heart has a regular rate and rhythm with a normal S1 and S2. There were no murmurs,  gallops or rubs. Lungs are clear to auscultation.  Skin: Extremities are without significant edema.  Musculoskeletal:  Back is nontender  Neurologic Exam  Mental status: The patient is alert and oriented x 3 at the time of the examination. The patient has apparent normal recent and remote memory, with an apparently normal attention span and concentration ability.   Speech is normal.  Cranial nerves: Extraocular movements are full. Pupils are equal, round, and reactive to light and accomodation.  Visual fields are full.  Facial symmetry is present. There is good facial sensation to soft touch bilaterally.Facial strength is normal.  Trapezius and sternocleidomastoid strength is normal. No dysarthria is noted.  The tongue is midline, and the patient has symmetric elevation of the soft palate. No obvious hearing deficits  are noted.  Motor:  Muscle bulk is normal.   Tone is normal. Strength is  5 / 5 in all 4 extremities.   Sensory: Sensory testing is intact to pinprick, soft touch and vibration sensation in all 4 extremities.  Coordination: Cerebellar testing reveals good finger-nose-finger and heel-to-shin bilaterally.  Gait and station: Station is normal.   Gait is normal. Tandem gait is mildly wide (normal for age). Romberg is negative.   Reflexes: Deep tendon reflexes are symmetric and normal bilaterally.   Plantar responses are flexor.    DIAGNOSTIC DATA (LABS, IMAGING, TESTING) - I reviewed patient records, labs, notes, testing and imaging myself where available.      ASSESSMENT AND PLAN  TIA (transient ischemic attack) - Plan: MR MRA NECK W WO CONTRAST, MR MRA HEAD WO CONTRAST, Sedimentation rate, C-reactive protein  Other headache syndrome - Plan: MR BRAIN WO CONTRAST, Sedimentation rate, C-reactive protein  Complicated migraine - Plan: MR MRA NECK W WO CONTRAST, MR MRA HEAD WO CONTRAST  Meniere disease, unspecified laterality    In summary, Mrs. Alberts is a 77 year old  woman with 3 episodes of transient loss of vision lasting up to an hour with 2 of the episodes being followed by headaches.  Her exam is normal.  MR angiogram from 2013 was normal.  MRI of the brain from 2013 and 2016 was normal for her age showing a few scattered T2/FLAIR hyperintense foci consistent with either age-appropriate minimal chronic microvascular ischemic change or the sequela of migraine.  I discussed with her that the 3 episodes were most likely complicated migraine headaches.  However, transient ischemic attacks might also give similar symptoms.  She does not neatly fit either as her symptoms were bilateral.   She had no other neurologic symptoms besides the visual loss during the episode.  She has a recent normal cardiac evaluation which makes cardio emboli less likely.  We need to check an MRI of the brain to determine if she has had a stroke or rule out other possible etiology.  Additionally we will check an MR angiogram of the neck and brain to determine if there is any significant stenosis, dissection or other abnormality.   We will also check ESR and CRP to determine if she might have temporal arteritis.   I will have her take aspirin 81 mg daily.  Additionally I will start metoprolol 25 mg daily as a prophylactic agent for migraine.  If she has additional spells while on metoprolol, consider a change to a calcium channel blocker.  She will return to see me in 6 weeks or sooner if she has new or worsening neurologic symptoms.  Thank you for asking me to see Ms. Gabbert.  Please let me know if I can be of further assistance with her or other patients in the future.  Richard A. Felecia Shelling, MD, [facility] 5/00/9381, 8:29 PM Certified in Neurology, Clinical Neurophysiology, Sleep Medicine, Pain Medicine and Neuroimaging  [facility] [address], [place], [place] [address] [phone]

## 2017-11-01 ENCOUNTER — Telehealth: Payer: Self-pay | Admitting: *Deleted

## 2017-11-01 LAB — C-REACTIVE PROTEIN: CRP: 5.9 mg/L — ABNORMAL HIGH (ref 0.0–4.9)

## 2017-11-01 LAB — SEDIMENTATION RATE: Sed Rate: 8 mm/hr (ref 0–40)

## 2017-11-01 NOTE — Telephone Encounter (Signed)
Spoke with Thayer Headings and reviewed below lab results.  She verbalized understanding of same/fim

## 2017-11-01 NOTE — Telephone Encounter (Signed)
-----   Message from Britt Bottom, MD sent at 11/01/2017 10:44 AM EDT ----- Please let the patient know that the lab work is fine.

## 2017-11-08 ENCOUNTER — Ambulatory Visit
Admission: RE | Admit: 2017-11-08 | Discharge: 2017-11-08 | Disposition: A | Discharge status: Home / Self Care | Payer: Medicare Other | Source: Ambulatory Visit | Attending: Neurology | Admitting: Neurology

## 2017-11-08 ENCOUNTER — Ambulatory Visit
Admission: RE | Admit: 2017-11-08 | Discharge: 2017-11-08 | Disposition: A | Discharge status: Home / Self Care | Payer: PPO | Source: Ambulatory Visit | Attending: Neurology | Admitting: Neurology

## 2017-11-08 DIAGNOSIS — G43109 Migraine with aura, not intractable, without status migrainosus: Secondary | ICD-10-CM | POA: Diagnosis not present

## 2017-11-08 DIAGNOSIS — G4489 Other headache syndrome: Secondary | ICD-10-CM

## 2017-11-08 DIAGNOSIS — G459 Transient cerebral ischemic attack, unspecified: Secondary | ICD-10-CM | POA: Diagnosis not present

## 2017-11-08 MED ORDER — GADOBENATE DIMEGLUMINE 529 MG/ML IV SOLN
16.0000 mL | Freq: Once | INTRAVENOUS | Status: AC | PRN
  Administered 2017-11-08: 16 mL via INTRAVENOUS

## 2017-11-09 ENCOUNTER — Telehealth: Payer: Self-pay | Admitting: *Deleted

## 2017-11-09 NOTE — Telephone Encounter (Signed)
I called and spoke with patient about results. Patient voiced understanding and appreciation and did not have any questions at this time.

## 2017-11-09 NOTE — Telephone Encounter (Signed)
-----   Message from Britt Bottom, MD sent at 11/09/2017 10:26 AM EDT ----- Please let her know that the MRI is good.  The MRI of the brain shows some age-related changes but no major change when compared to the MRI she had 3 years ago.  The MR angiograms looked normal, no stenosis.

## 2017-12-06 DIAGNOSIS — I10 Essential (primary) hypertension: Secondary | ICD-10-CM | POA: Diagnosis not present

## 2017-12-06 DIAGNOSIS — R0600 Dyspnea, unspecified: Secondary | ICD-10-CM | POA: Diagnosis not present

## 2017-12-06 DIAGNOSIS — I160 Hypertensive urgency: Secondary | ICD-10-CM | POA: Diagnosis not present

## 2017-12-06 DIAGNOSIS — R002 Palpitations: Secondary | ICD-10-CM | POA: Diagnosis not present

## 2017-12-06 DIAGNOSIS — I498 Other specified cardiac arrhythmias: Secondary | ICD-10-CM | POA: Diagnosis not present

## 2017-12-13 ENCOUNTER — Ambulatory Visit (INDEPENDENT_AMBULATORY_CARE_PROVIDER_SITE_OTHER): Payer: PPO | Admitting: Neurology

## 2017-12-13 ENCOUNTER — Encounter: Payer: Self-pay | Admitting: Neurology

## 2017-12-13 VITALS — BP 152/60 | HR 71 | Resp 16 | Ht 63.0 in | Wt 171.0 lb

## 2017-12-13 DIAGNOSIS — H547 Unspecified visual loss: Secondary | ICD-10-CM | POA: Diagnosis not present

## 2017-12-13 DIAGNOSIS — R519 Headache, unspecified: Secondary | ICD-10-CM

## 2017-12-13 DIAGNOSIS — R51 Headache: Secondary | ICD-10-CM

## 2017-12-13 DIAGNOSIS — I6782 Cerebral ischemia: Secondary | ICD-10-CM | POA: Diagnosis not present

## 2017-12-13 NOTE — Progress Notes (Signed)
[facility]  PATIENT: Andrea Owen DOB: 05/24/1941  REFERRING DOCTOR OR PCP:  Shon Hough (Ophthalmology); Dr. Ashby Dawes (PCP) SOURCE: Patient, telephone conversation with Dr. Kathrin Penner and notes, MRI reports and MRI images on PACS  _________________________________   HISTORICAL  CHIEF COMPLAINT:  Chief Complaint  Patient presents with  . Transient Vision Loss    Denies further episodes of vision loss, and sts.h/a's have been less frequent.   Is taking ASA 7m daily, and Toprol, although not daily, for migraine prevention./fim    HISTORY OF PRESENT ILLNESS:  Andrea Owen a 77yo woman with  intermittent episodes of total visual loss and headache  Update 12/13/2017: Since the last visit, she denies any more episodes of loss of vision.  Her headaches are less frequent.  She continues to take Toprol and aspirin.   The MRI of the brain 11/08/2017 showed some scattered T2/FLAIR hyperintense foci most consistent with mild chronic microvascular ischemic change and only minimally progressed compared to the 2016 MRI.  The MR angiogram of the head and neck were normal.    ESR was normal and CRP was minimally elevated.      I reviewed the MRi in her presence and it shows mild chronic microvascular ischemic change.     Her brother was diagnosed with Stage 4 colon cancer and [place]   She was very anxious and flet bad.  Her BP was markedly elevated and she went to the ED and was reassured  From 10/31/2017: Andrea Owen a 77year old woman with a history of benign essential hypertension and hyperlipidemia     She had her first episode 10/10/2017.  She noted black spots in her vision and a few seconds later was unable to see anything including light.   This persisted about 1 hour and resolved.    During the episodes, she had no dizziness or lightheadedness.   She feels cognition was unchanged and she was able to talk and understand others.As the vision  cleared, she had the onset of a headache that seemed bifrontal.  Pain was moderate.   There was no nausea.    There was no photophobia and phonophobia.  The second episode was a few days later and was similar except there was no headache.   The third episode was similar but shorter and the headache was milder,    She took ibuprofen but the headache still persisted the rest of the day but was resolved in the morning.        She seldom gets headaches..    She has had bilateral lens replacements for cataracts but has no other eye disorder.  She has no history of migraines.      She has hypertension and elevated cholesterol but has no cardiology issues.  She reports a normal for age stress test and Echocardiogram.     She sees Dr. GEinar Gip     She has 3 brothers with cerebral aneurysm.    MRI/MRA in 2013 were normal for age.   I personally reviewed the MRA from 2013 and the MRI from 2016. The MRA was normal. The left vertebral artery is dominant, a normal variant. The MRI shows some scattered T2/flair hyperintense foci predominantly in the subcortical and deep white matter. This is nonspecific and could be due to chronic microvascular ischemic change related to her age or to migraines.  She had the flu twice and was treated with Tamiflu.    She also had  several rounds of antibiotics.      She has Meniere's disease and her last attack was 3 months with severe vertigo and poor balance.   These episodes can last days..  She gets nausea and vomiting  When she has a spell she takes meclizine and sleeps and is better when she wakes up.   She has mild bilateral hearing loss.      REVIEW OF SYSTEMS: Constitutional: No fevers, chills, sweats, or change in appetite Eyes: No visual changes, double vision, eye pain Ear, nose and throat: No hearing loss, ear pain, nasal congestion, sore throat Cardiovascular: No chest pain, palpitations Respiratory: No shortness of breath at rest or with exertion.   No  wheezes GastrointestinaI: No nausea, vomiting, diarrhea, abdominal pain, fecal incontinence Genitourinary: No dysuria, urinary retention or frequency.  No nocturia. Musculoskeletal: No neck pain, back pain Integumentary: No rash, pruritus, skin lesions Neurological: as above Psychiatric: No depression at this time.  No anxiety Endocrine: No palpitations, diaphoresis, change in appetite, change in weigh or increased thirst Hematologic/Lymphatic: No anemia, purpura, petechiae. Allergic/Immunologic: No itchy/runny eyes, nasal congestion, recent allergic reactions, rashes  ALLERGIES: Allergies  Allergen Reactions  . Codeine Rash    HOME MEDICATIONS:  Current Outpatient Medications:  .  losartan (COZAAR) 100 MG tablet, , Disp: , Rfl: 11 .  metoprolol tartrate (LOPRESSOR) 25 MG tablet, Take 1 tablet (25 mg total) by mouth 2 (two) times daily., Disp: 30 tablet, Rfl: 5 .  rosuvastatin (CRESTOR) 10 MG tablet, , Disp: , Rfl: 11  PAST MEDICAL HISTORY: Past Medical History:  Diagnosis Date  . Hypertension   . Vision abnormalities     PAST SURGICAL HISTORY:   FAMILY HISTORY: Family History  Problem Relation Age of Onset  . Colon cancer Mother   . Heart attack Father   . Congestive Heart Failure Brother   . Other Brother   . Deafness Brother   . Heart disease Brother   . Aneurysm Brother   . Aneurysm Brother     SOCIAL HISTORY:  Social History   Socioeconomic History  . Marital status: Married    Spouse name: Not on file  . Number of children: Not on file  . Years of education: Not on file  . Highest education level: Not on file  Occupational History  . Not on file  Social Needs  . Financial resource strain: Not on file  . Food insecurity:    Worry: Not on file    Inability: Not on file  . Transportation needs:    Medical: Not on file    Non-medical: Not on file  Tobacco Use  . Smoking status: Never Smoker  . Smokeless tobacco: Never Used  Substance and  Sexual Activity  . Alcohol use: Not on file  . Drug use: Not on file  . Sexual activity: Not on file  Lifestyle  . Physical activity:    Days per week: Not on file    Minutes per session: Not on file  . Stress: Not on file  Relationships  . Social connections:    Talks on phone: Not on file    Gets together: Not on file    Attends religious service: Not on file    Active member of club or organization: Not on file    Attends meetings of clubs or organizations: Not on file    Relationship status: Not on file  . Intimate partner violence:    Fear of current or ex partner: Not  on file    Emotionally abused: Not on file    Physically abused: Not on file    Forced sexual activity: Not on file  Other Topics Concern  . Not on file  Social History Narrative  . Not on file     PHYSICAL EXAM  Vitals:   12/13/17 1420  BP: (!) 152/60  Pulse: 71  Resp: 16  Weight: 171 lb (77.6 kg)  Height: '5\' 3"'  (1.6 m)    Body mass index is 30.29 kg/m.   General: The patient is well-developed and well-nourished and in no acute distress   Neck: No carotid bruits are noted.  The neck is nontender.   Neurologic Exam  Mental status: The patient is alert and oriented x 3 at the time of the examination. The patient has apparent normal recent and remote memory, with an apparently normal attention span and concentration ability.   Speech is normal.  Cranial nerves: Extraocular muscles are intact.  Funduscopic examination is normal.  Facial strength and sensation is normal.  Trapezius strength is strong The tongue is midline, and the patient has symmetric elevation of the soft palate. No obvious hearing deficits are noted.  Motor:  Muscle bulk is normal.   Tone is normal. Strength is  5 / 5 in all 4 extremities.   Sensory: Sensory testing is intact to pinprick, soft touch and vibration sensation in all 4 extremities.  Coordination: She has intact finger-nose-finger and heel-to-shin  Gait and  station: Station is normal.   Gait is normal.  Tandem gait is mildly wide which is probably normal for age. Romberg is negative.   Reflexes: Deep tendon reflexes are symmetric and normal bilaterally.      DIAGNOSTIC DATA (LABS, IMAGING, TESTING) - I reviewed patient records, labs, notes, testing and imaging myself where available.      ASSESSMENT AND PLAN  Visual loss  Subcortical microvascular ischemic occlusive disease   1.   Continue metoprolol for migraine prophylaxis. 2.   81 mg daily.  Continue aspirin 3.   I reviewed the results of the MRI and MR angiogram in her presence. 4.   She will return to see me as needed if she has new or worsening neurologic symptoms.  Jeramy Dimmick A. Felecia Shelling, MD, [facility] 7/40/8144, 8:18 PM Certified in Neurology, Clinical Neurophysiology, Sleep Medicine, Pain Medicine and Neuroimaging  [facility] [address], [place], [place] [address] [phone]

## 2017-12-18 ENCOUNTER — Ambulatory Visit: Payer: Medicare Other | Admitting: Neurology

## 2018-01-11 ENCOUNTER — Telehealth: Payer: Self-pay | Admitting: Neurology

## 2018-01-11 MED ORDER — METHYLPREDNISOLONE 4 MG PO TABS: ORAL_TABLET | ORAL | 0 refills | Status: DC

## 2018-01-11 NOTE — Addendum Note (Signed)
Addended by: France Ravens I on: 01/11/2018 12:07 PM   Modules accepted: Orders

## 2018-01-11 NOTE — Telephone Encounter (Signed)
Spoke with Andrea Owen.  She sts. she had a migraine yesterday, h/a better today but still present, head "feels sore all over."  Per RAS, ok for Medrol dose. Pt. agreeable with this plan.  pk.  Rx. sent to [facility] per pt's request/fim

## 2018-01-11 NOTE — Telephone Encounter (Signed)
Pt states she has had a terrible migraine since yesterday and she'd like to know if something could be called in for her today.  Pt uses  [place], [place] [facility] [place] [phone] (Phone) [phone] (Fax)

## 2018-01-23 DIAGNOSIS — E782 Mixed hyperlipidemia: Secondary | ICD-10-CM | POA: Diagnosis not present

## 2018-01-23 DIAGNOSIS — I10 Essential (primary) hypertension: Secondary | ICD-10-CM | POA: Diagnosis not present

## 2018-01-30 DIAGNOSIS — I10 Essential (primary) hypertension: Secondary | ICD-10-CM | POA: Diagnosis not present

## 2018-01-30 DIAGNOSIS — R600 Localized edema: Secondary | ICD-10-CM | POA: Diagnosis not present

## 2018-01-30 DIAGNOSIS — E782 Mixed hyperlipidemia: Secondary | ICD-10-CM | POA: Diagnosis not present

## 2018-05-14 DIAGNOSIS — E782 Mixed hyperlipidemia: Secondary | ICD-10-CM | POA: Diagnosis not present

## 2018-05-14 DIAGNOSIS — R600 Localized edema: Secondary | ICD-10-CM | POA: Diagnosis not present

## 2018-05-14 DIAGNOSIS — I10 Essential (primary) hypertension: Secondary | ICD-10-CM | POA: Diagnosis not present

## 2018-05-14 DIAGNOSIS — N390 Urinary tract infection, site not specified: Secondary | ICD-10-CM | POA: Diagnosis not present

## 2018-05-14 DIAGNOSIS — Z0000 Encounter for general adult medical examination without abnormal findings: Secondary | ICD-10-CM | POA: Diagnosis not present

## 2018-05-22 DIAGNOSIS — E782 Mixed hyperlipidemia: Secondary | ICD-10-CM | POA: Diagnosis not present

## 2018-05-22 DIAGNOSIS — R600 Localized edema: Secondary | ICD-10-CM | POA: Diagnosis not present

## 2018-05-22 DIAGNOSIS — I341 Nonrheumatic mitral (valve) prolapse: Secondary | ICD-10-CM | POA: Diagnosis not present

## 2018-05-22 DIAGNOSIS — I10 Essential (primary) hypertension: Secondary | ICD-10-CM | POA: Diagnosis not present

## 2018-05-22 DIAGNOSIS — H8109 Meniere's disease, unspecified ear: Secondary | ICD-10-CM | POA: Diagnosis not present

## 2018-05-22 DIAGNOSIS — Z0000 Encounter for general adult medical examination without abnormal findings: Secondary | ICD-10-CM | POA: Diagnosis not present

## 2018-05-22 DIAGNOSIS — M545 Low back pain: Secondary | ICD-10-CM | POA: Diagnosis not present

## 2018-06-26 DIAGNOSIS — Z1231 Encounter for screening mammogram for malignant neoplasm of breast: Secondary | ICD-10-CM | POA: Diagnosis not present

## 2018-06-26 DIAGNOSIS — Z853 Personal history of malignant neoplasm of breast: Secondary | ICD-10-CM | POA: Diagnosis not present

## 2018-07-05 ENCOUNTER — Other Ambulatory Visit: Payer: Self-pay | Admitting: Neurology

## 2019-02-11 ENCOUNTER — Other Ambulatory Visit: Payer: Self-pay | Admitting: Neurology

## 2019-03-20 ENCOUNTER — Other Ambulatory Visit: Payer: Self-pay

## 2019-06-11 DIAGNOSIS — E782 Mixed hyperlipidemia: Secondary | ICD-10-CM | POA: Diagnosis not present

## 2019-06-11 DIAGNOSIS — Z780 Asymptomatic menopausal state: Secondary | ICD-10-CM | POA: Diagnosis not present

## 2019-06-11 DIAGNOSIS — Z7189 Other specified counseling: Secondary | ICD-10-CM | POA: Diagnosis not present

## 2019-06-11 DIAGNOSIS — N390 Urinary tract infection, site not specified: Secondary | ICD-10-CM | POA: Diagnosis not present

## 2019-06-11 DIAGNOSIS — I10 Essential (primary) hypertension: Secondary | ICD-10-CM | POA: Diagnosis not present

## 2019-06-11 DIAGNOSIS — Z0000 Encounter for general adult medical examination without abnormal findings: Secondary | ICD-10-CM | POA: Diagnosis not present

## 2019-06-18 DIAGNOSIS — I341 Nonrheumatic mitral (valve) prolapse: Secondary | ICD-10-CM | POA: Diagnosis not present

## 2019-06-18 DIAGNOSIS — H8109 Meniere's disease, unspecified ear: Secondary | ICD-10-CM | POA: Diagnosis not present

## 2019-06-18 DIAGNOSIS — R600 Localized edema: Secondary | ICD-10-CM | POA: Diagnosis not present

## 2019-06-18 DIAGNOSIS — M545 Low back pain: Secondary | ICD-10-CM | POA: Diagnosis not present

## 2019-06-18 DIAGNOSIS — I10 Essential (primary) hypertension: Secondary | ICD-10-CM | POA: Diagnosis not present

## 2019-06-18 DIAGNOSIS — E782 Mixed hyperlipidemia: Secondary | ICD-10-CM | POA: Diagnosis not present

## 2019-06-18 DIAGNOSIS — Z0000 Encounter for general adult medical examination without abnormal findings: Secondary | ICD-10-CM | POA: Diagnosis not present

## 2019-06-18 DIAGNOSIS — Z7189 Other specified counseling: Secondary | ICD-10-CM | POA: Diagnosis not present

## 2019-07-02 DIAGNOSIS — Z1231 Encounter for screening mammogram for malignant neoplasm of breast: Secondary | ICD-10-CM | POA: Diagnosis not present

## 2019-07-02 DIAGNOSIS — Z853 Personal history of malignant neoplasm of breast: Secondary | ICD-10-CM | POA: Diagnosis not present

## 2019-12-24 DIAGNOSIS — E782 Mixed hyperlipidemia: Secondary | ICD-10-CM | POA: Diagnosis not present

## 2019-12-24 DIAGNOSIS — R600 Localized edema: Secondary | ICD-10-CM | POA: Diagnosis not present

## 2019-12-24 DIAGNOSIS — I10 Essential (primary) hypertension: Secondary | ICD-10-CM | POA: Diagnosis not present

## 2019-12-31 DIAGNOSIS — M545 Low back pain: Secondary | ICD-10-CM | POA: Diagnosis not present

## 2019-12-31 DIAGNOSIS — E782 Mixed hyperlipidemia: Secondary | ICD-10-CM | POA: Diagnosis not present

## 2019-12-31 DIAGNOSIS — H8109 Meniere's disease, unspecified ear: Secondary | ICD-10-CM | POA: Diagnosis not present

## 2019-12-31 DIAGNOSIS — I341 Nonrheumatic mitral (valve) prolapse: Secondary | ICD-10-CM | POA: Diagnosis not present

## 2019-12-31 DIAGNOSIS — R600 Localized edema: Secondary | ICD-10-CM | POA: Diagnosis not present

## 2019-12-31 DIAGNOSIS — I10 Essential (primary) hypertension: Secondary | ICD-10-CM | POA: Diagnosis not present

## 2020-06-17 DIAGNOSIS — H8109 Meniere's disease, unspecified ear: Secondary | ICD-10-CM | POA: Diagnosis not present

## 2020-06-17 DIAGNOSIS — R600 Localized edema: Secondary | ICD-10-CM | POA: Diagnosis not present

## 2020-06-17 DIAGNOSIS — I10 Essential (primary) hypertension: Secondary | ICD-10-CM | POA: Diagnosis not present

## 2020-06-17 DIAGNOSIS — E782 Mixed hyperlipidemia: Secondary | ICD-10-CM | POA: Diagnosis not present

## 2020-06-24 DIAGNOSIS — E782 Mixed hyperlipidemia: Secondary | ICD-10-CM | POA: Diagnosis not present

## 2020-06-24 DIAGNOSIS — R600 Localized edema: Secondary | ICD-10-CM | POA: Diagnosis not present

## 2020-06-24 DIAGNOSIS — I10 Essential (primary) hypertension: Secondary | ICD-10-CM | POA: Diagnosis not present

## 2020-06-24 DIAGNOSIS — I341 Nonrheumatic mitral (valve) prolapse: Secondary | ICD-10-CM | POA: Diagnosis not present

## 2020-06-24 DIAGNOSIS — H8109 Meniere's disease, unspecified ear: Secondary | ICD-10-CM | POA: Diagnosis not present

## 2020-06-24 DIAGNOSIS — Z0000 Encounter for general adult medical examination without abnormal findings: Secondary | ICD-10-CM | POA: Diagnosis not present

## 2020-06-24 DIAGNOSIS — M5459 Other low back pain: Secondary | ICD-10-CM | POA: Diagnosis not present

## 2020-07-07 DIAGNOSIS — Z1231 Encounter for screening mammogram for malignant neoplasm of breast: Secondary | ICD-10-CM | POA: Diagnosis not present

## 2020-12-02 DIAGNOSIS — H26493 Other secondary cataract, bilateral: Secondary | ICD-10-CM | POA: Diagnosis not present

## 2020-12-02 DIAGNOSIS — Z961 Presence of intraocular lens: Secondary | ICD-10-CM | POA: Diagnosis not present

## 2020-12-02 DIAGNOSIS — H43813 Vitreous degeneration, bilateral: Secondary | ICD-10-CM | POA: Diagnosis not present

## 2020-12-02 DIAGNOSIS — H52203 Unspecified astigmatism, bilateral: Secondary | ICD-10-CM | POA: Diagnosis not present

## 2021-01-06 DIAGNOSIS — I10 Essential (primary) hypertension: Secondary | ICD-10-CM | POA: Diagnosis not present

## 2021-01-06 DIAGNOSIS — E782 Mixed hyperlipidemia: Secondary | ICD-10-CM | POA: Diagnosis not present

## 2021-01-13 DIAGNOSIS — E782 Mixed hyperlipidemia: Secondary | ICD-10-CM | POA: Diagnosis not present

## 2021-01-13 DIAGNOSIS — I10 Essential (primary) hypertension: Secondary | ICD-10-CM | POA: Diagnosis not present

## 2021-02-10 DIAGNOSIS — I10 Essential (primary) hypertension: Secondary | ICD-10-CM | POA: Diagnosis not present

## 2021-02-10 DIAGNOSIS — E782 Mixed hyperlipidemia: Secondary | ICD-10-CM | POA: Diagnosis not present

## 2021-06-22 DIAGNOSIS — I10 Essential (primary) hypertension: Secondary | ICD-10-CM | POA: Diagnosis not present

## 2021-06-22 DIAGNOSIS — E782 Mixed hyperlipidemia: Secondary | ICD-10-CM | POA: Diagnosis not present

## 2021-06-30 DIAGNOSIS — I10 Essential (primary) hypertension: Secondary | ICD-10-CM | POA: Diagnosis not present

## 2021-06-30 DIAGNOSIS — N182 Chronic kidney disease, stage 2 (mild): Secondary | ICD-10-CM | POA: Diagnosis not present

## 2021-06-30 DIAGNOSIS — R600 Localized edema: Secondary | ICD-10-CM | POA: Diagnosis not present

## 2021-06-30 DIAGNOSIS — H8109 Meniere's disease, unspecified ear: Secondary | ICD-10-CM | POA: Diagnosis not present

## 2021-06-30 DIAGNOSIS — R7303 Prediabetes: Secondary | ICD-10-CM | POA: Diagnosis not present

## 2021-06-30 DIAGNOSIS — Z0000 Encounter for general adult medical examination without abnormal findings: Secondary | ICD-10-CM | POA: Diagnosis not present

## 2021-06-30 DIAGNOSIS — E782 Mixed hyperlipidemia: Secondary | ICD-10-CM | POA: Diagnosis not present

## 2021-07-13 DIAGNOSIS — Z1231 Encounter for screening mammogram for malignant neoplasm of breast: Secondary | ICD-10-CM | POA: Diagnosis not present

## 2021-12-08 DIAGNOSIS — Z961 Presence of intraocular lens: Secondary | ICD-10-CM | POA: Diagnosis not present

## 2021-12-08 DIAGNOSIS — H524 Presbyopia: Secondary | ICD-10-CM | POA: Diagnosis not present

## 2021-12-08 DIAGNOSIS — H04123 Dry eye syndrome of bilateral lacrimal glands: Secondary | ICD-10-CM | POA: Diagnosis not present

## 2022-03-12 ENCOUNTER — Emergency Department (HOSPITAL_COMMUNITY): Payer: PPO

## 2022-03-12 ENCOUNTER — Observation Stay (HOSPITAL_COMMUNITY)
Admission: EM | Admit: 2022-03-12 | Discharge: 2022-03-14 | Disposition: A | Discharge status: Home / Self Care | Payer: PPO | Attending: Family Medicine | Admitting: Family Medicine

## 2022-03-12 ENCOUNTER — Encounter (HOSPITAL_COMMUNITY): Payer: Self-pay

## 2022-03-12 ENCOUNTER — Other Ambulatory Visit: Payer: Self-pay

## 2022-03-12 DIAGNOSIS — I2510 Atherosclerotic heart disease of native coronary artery without angina pectoris: Secondary | ICD-10-CM | POA: Insufficient documentation

## 2022-03-12 DIAGNOSIS — I700 Atherosclerosis of aorta: Secondary | ICD-10-CM | POA: Diagnosis not present

## 2022-03-12 DIAGNOSIS — R0789 Other chest pain: Secondary | ICD-10-CM | POA: Diagnosis not present

## 2022-03-12 DIAGNOSIS — Z79899 Other long term (current) drug therapy: Secondary | ICD-10-CM | POA: Diagnosis not present

## 2022-03-12 DIAGNOSIS — K449 Diaphragmatic hernia without obstruction or gangrene: Secondary | ICD-10-CM | POA: Diagnosis not present

## 2022-03-12 DIAGNOSIS — I10 Essential (primary) hypertension: Secondary | ICD-10-CM | POA: Insufficient documentation

## 2022-03-12 DIAGNOSIS — R079 Chest pain, unspecified: Secondary | ICD-10-CM | POA: Diagnosis not present

## 2022-03-12 DIAGNOSIS — K7689 Other specified diseases of liver: Secondary | ICD-10-CM | POA: Diagnosis not present

## 2022-03-12 DIAGNOSIS — J984 Other disorders of lung: Secondary | ICD-10-CM | POA: Diagnosis not present

## 2022-03-12 DIAGNOSIS — M47816 Spondylosis without myelopathy or radiculopathy, lumbar region: Secondary | ICD-10-CM | POA: Diagnosis not present

## 2022-03-12 DIAGNOSIS — E782 Mixed hyperlipidemia: Secondary | ICD-10-CM

## 2022-03-12 DIAGNOSIS — R778 Other specified abnormalities of plasma proteins: Secondary | ICD-10-CM | POA: Diagnosis not present

## 2022-03-12 DIAGNOSIS — J9811 Atelectasis: Secondary | ICD-10-CM | POA: Diagnosis not present

## 2022-03-12 LAB — BASIC METABOLIC PANEL
Anion gap: 8 (ref 5–15)
BUN: 7 mg/dL — ABNORMAL LOW (ref 8–23)
CO2: 27 mmol/L (ref 22–32)
Calcium: 9.8 mg/dL (ref 8.9–10.3)
Chloride: 104 mmol/L (ref 98–111)
Creatinine, Ser: 0.78 mg/dL (ref 0.44–1.00)
GFR, Estimated: >60 mL/min (ref >60)
Glucose, Bld: 131 mg/dL — ABNORMAL HIGH (ref 70–99)
Potassium: 4 mmol/L (ref 3.5–5.1)
Sodium: 139 mmol/L (ref 135–145)

## 2022-03-12 LAB — CBC
HCT: 42.4 % (ref 36.0–46.0)
Hemoglobin: 14.6 g/dL (ref 12.0–15.0)
MCH: 29.9 pg (ref 26.0–34.0)
MCHC: 34.4 g/dL (ref 30.0–36.0)
MCV: 86.9 fL (ref 80.0–100.0)
Platelets: 298 10*3/uL (ref 150–400)
RBC: 4.88 MIL/uL (ref 3.87–5.11)
RDW: 14.2 % (ref 11.5–15.5)
WBC: 5.9 10*3/uL (ref 4.0–10.5)
nRBC: 0 % (ref 0.0–0.2)

## 2022-03-12 LAB — TROPONIN I (HIGH SENSITIVITY)
Troponin I (High Sensitivity): 11 ng/L (ref <18)
Troponin I (High Sensitivity): 53 ng/L — ABNORMAL HIGH (ref <18)
Troponin I (High Sensitivity): 58 ng/L — ABNORMAL HIGH (ref <18)

## 2022-03-12 MED ORDER — IOHEXOL 350 MG/ML SOLN
100.0000 mL | Freq: Once | INTRAVENOUS | Status: AC | PRN
  Administered 2022-03-12: 100 mL via INTRAVENOUS

## 2022-03-12 MED ORDER — METOPROLOL TARTRATE 5 MG/5ML IV SOLN
5.0000 mg | Freq: Once | INTRAVENOUS | Status: AC
  Administered 2022-03-12: 5 mg via INTRAVENOUS
  Filled 2022-03-12: qty 5

## 2022-03-12 MED ORDER — HYDRALAZINE HCL 20 MG/ML IJ SOLN
10.0000 mg | Freq: Once | INTRAMUSCULAR | Status: AC
  Administered 2022-03-12: 10 mg via INTRAVENOUS
  Filled 2022-03-12: qty 1

## 2022-03-12 NOTE — ED Provider Triage Note (Signed)
Emergency Medicine Provider Triage Evaluation Note  Andrea Owen , a 81 y.o. female  was evaluated in triage.  Pt complains of chest pressure onset 1 week.  Notes that her chest pressure returned into chest pain and acutely got worse today for 1 hour and resolved. Notes that her pain this morning felt like a heaviness and nausea especially with sitting.  Was given 324 mg aspirin by EMS prior to arrival. Denies nausea, vomiting, shortness of breath, blurred vision, double vision.   Review of Systems  Positive: As per HPI Negative:   Physical Exam  BP (!) 204/67 (BP Location: Right Arm)   Pulse 73   Temp 98.3 F (36.8 C) (Oral)   Resp 18   Ht '5\' 3"'$  (1.6 m)   Wt 77.6 kg   SpO2 96%   BMI 30.29 kg/m  Gen:   Awake, no distress   Resp:  Normal effort  MSK:   Moves extremities without difficulty  Other:  No chest wall TTP.   Medical Decision Making  Medically screening exam initiated at 1:00 PM.  Appropriate orders placed.  Andrea Owen was informed that the remainder of the evaluation will be completed by another provider, this initial triage assessment does not replace that evaluation, and the importance of remaining in the ED until their evaluation is complete.  Work-up initiated   Mateja Dier A, PA-C 03/12/22 1307

## 2022-03-12 NOTE — ED Notes (Signed)
Back from CT

## 2022-03-12 NOTE — Subjective & Objective (Signed)
CC: chest pain HPI: 81 year old white female history of hypertension, hyperlipidemia presents to the ER today with 7 days of chest pain.  She states that she woke up on last Monday morning with chest pain.  She states its been constant squeezing chest pressure for the entire week.  She states that she finally got scared today called EMS.  She took 4 baby aspirin's's felt better.  No early family history of heart disease.  She states that her father had a heart attack in his 20s.  She has no history of diabetes.  She does not smoke.  She does not exercise on a regular basis but states that she does walk up and down the stairs in her home without difficulty.  First troponin was 11, subsequently 53, then 58.  EKG shows normal sinus rhythm without ST changes.  [facility] hospitalist contacted for admission.

## 2022-03-12 NOTE — Assessment & Plan Note (Signed)
Check lipid panel. Continue 10 mg crestor.

## 2022-03-12 NOTE — H&P (Signed)
History and Physical    Andrea Owen RCV:[id number] DOB: [birth year] DOA: 03/12/2022  DOS: the patient was seen and examined on 03/12/2022  PCP: Merrilee Seashore, MD   Patient coming from: Home  I have personally briefly reviewed patient's old medical records in [place]  CC: chest pain HPI: 81 year old white female history of hypertension, hyperlipidemia presents to the ER today with 7 days of chest pain.  She states that she woke up on last Monday morning with chest pain.  She states its been constant squeezing chest pressure for the entire week.  She states that she finally got scared today called EMS.  She took 4 baby aspirin's's felt better.  No early family history of heart disease.  She states that her father had a heart attack in his 75s.  She has no history of diabetes.  She does not smoke.  She does not exercise on a regular basis but states that she does walk up and down the stairs in her home without difficulty.  First troponin was 11, subsequently 53, then 58.  EKG shows normal sinus rhythm without ST changes.  [facility] hospitalist contacted for admission.   ED Course: Initial troponin 11, 53 then 58  Review of Systems:  Review of Systems  Constitutional: Negative.   HENT: Negative.    Eyes: Negative.   Respiratory: Negative.    Cardiovascular:        Squeezing substernal chest pressure.  No associated radiation.  No shortness of breath.  No nausea, vomiting, diaphoresis.  Chest pressure not associate with any activity.  Nothing makes it worse.  Aspirin made it better.  Gastrointestinal: Negative.   Genitourinary: Negative.   Musculoskeletal: Negative.   Skin: Negative.   Neurological: Negative.   Endo/Heme/Allergies: Negative.   Psychiatric/Behavioral: Negative.    All other systems reviewed and are negative.   Past Medical History:  Diagnosis Date   Hypertension    Vision abnormalities     Past Surgical History:  Procedure Laterality Date    MASTECTOMY Bilateral    MENISCUS REPAIR Right    PARTIAL HYSTERECTOMY       reports that she has never smoked. She has never used smokeless tobacco. She reports that she does not drink alcohol and does not use drugs.  Allergies  Allergen Reactions   Atorvastatin     Other reaction(s): myalgia   Pravastatin Sodium     Other reaction(s): myalgia   Rosuvastatin Calcium     Other reaction(s): myalgia   Zocor [Simvastatin]     Other reaction(s): myalgia   Codeine Rash    Family History  Problem Relation Age of Onset   Colon cancer Mother    Heart attack Father    Congestive Heart Failure Brother    Other Brother    Deafness Brother    Heart disease Brother    Aneurysm Brother    Aneurysm Brother     Prior to Admission medications   Medication Sig Start Date End Date Taking? Authorizing Provider  albuterol (VENTOLIN HFA) 108 (90 Base) MCG/ACT inhaler Inhale 2 puffs into the lungs every 6 (six) hours as needed for wheezing or shortness of breath.   Yes [provider]  Calcium-Magnesium (CALCIUM MAGNESIUM 750) 300-300 MG TABS Take 1 tablet by mouth daily.   Yes [provider]  Cholecalciferol (VITAMIN D) 50 MCG (2000 UT) tablet Take 2,000 Units by mouth daily.   Yes [provider]  losartan (COZAAR) 100 MG tablet Take 100  mg by mouth daily. 10/30/17  Yes [provider]  meclizine (ANTIVERT) 25 MG tablet Take 25 mg by mouth daily as needed for dizziness. 11/02/14  Yes [provider]  Multiple Vitamin (MULTIVITAMIN WITH MINERALS) TABS tablet Take 1 tablet by mouth daily.   Yes [provider]  rosuvastatin (CRESTOR) 10 MG tablet Take 10 mg by mouth every evening. 10/11/17  Yes [provider]  methylPREDNISolone (MEDROL) 4 MG tablet Take 6 tablets on day 1, 5 tablets on day 2, 4 tablets on day 3, 3 tablets on day 4, 2 tablets on day 5, and 1 tablet on day 6 Patient not taking: Reported on 03/12/2022 01/11/18   Britt Bottom, MD  metoprolol tartrate (LOPRESSOR) 25 MG tablet TAKE 1 TABLET BY MOUTH 2 TIMES DAILY. Patient not taking: Reported on 03/12/2022 07/05/18   Britt Bottom, MD    Physical Exam: Vitals:   03/12/22 2200 03/12/22 2230 03/12/22 2300 03/12/22 2307  BP: (!) 169/49 (!) 165/50 (!) 170/78   Pulse: 77 80 81   Resp: (!) '26 18 19   '$ Temp:    98.4 F (36.9 C)  TempSrc:    Oral  SpO2: 96% 96% 96%   Weight:      Height:        Physical Exam Vitals and nursing note reviewed.  Constitutional:      General: She is not in acute distress.    Appearance: Normal appearance. She is not ill-appearing, toxic-appearing or diaphoretic.  HENT:     Head: Normocephalic and atraumatic.     Nose: Nose normal.  Eyes:     General: No scleral icterus. Cardiovascular:     Rate and Rhythm: Normal rate and regular rhythm.     Pulses: Normal pulses.  Pulmonary:     Effort: Pulmonary effort is normal.     Breath sounds: Normal breath sounds.  Abdominal:     General: Abdomen is flat. Bowel sounds are normal. There is no distension.     Palpations: Abdomen is soft.     Tenderness: There is no abdominal tenderness. There is no rebound.  Musculoskeletal:     Right lower leg: No edema.     Left lower leg: No edema.  Skin:    General: Skin is warm and dry.     Capillary Refill: Capillary refill takes less than 2 seconds.  Neurological:     General: No focal deficit present.     Mental Status: She is alert and oriented to person, place, and time.      Labs on Admission: I have personally reviewed following labs and imaging studies  CBC: Recent Labs  Lab 03/12/22 1322  WBC 5.9  HGB 14.6  HCT 42.4  MCV 86.9  PLT 834   Basic Metabolic Panel: Recent Labs  Lab 03/12/22 1322  NA 139  K 4.0  CL 104  CO2 27  GLUCOSE 131*  BUN 7*  CREATININE 0.78  CALCIUM 9.8   GFR: Estimated Creatinine Clearance: 52.6 mL/min (by C-G formula based on SCr of 0.78 mg/dL). Liver Function Tests: No  results for input(s): "AST", "ALT", "ALKPHOS", "BILITOT", "PROT", "ALBUMIN" in the last 168 hours. No results for input(s): "LIPASE", "AMYLASE" in the last 168 hours. No results for input(s): "AMMONIA" in the last 168 hours. Coagulation Profile: No results for input(s): "INR", "PROTIME" in the last 168 hours. Cardiac Enzymes: Recent Labs  Lab 03/12/22 1322 03/12/22 1630 03/12/22 2040  TROPONINIHS 11 53* 58*  BNP (last 3 results) No results for input(s): "PROBNP" in the last 8760 hours. HbA1C: No results for input(s): "HGBA1C" in the last 72 hours. CBG: No results for input(s): "GLUCAP" in the last 168 hours. Lipid Profile: No results for input(s): "CHOL", "HDL", "LDLCALC", "TRIG", "CHOLHDL", "LDLDIRECT" in the last 72 hours. Thyroid Function Tests: No results for input(s): "TSH", "T4TOTAL", "FREET4", "T3FREE", "THYROIDAB" in the last 72 hours. Anemia Panel: No results for input(s): "VITAMINB12", "FOLATE", "FERRITIN", "TIBC", "IRON", "RETICCTPCT" in the last 72 hours. Urine analysis: No results found for: "COLORURINE", "APPEARANCEUR", "LABSPEC", "PHURINE", "GLUCOSEU", "HGBUR", "BILIRUBINUR", "KETONESUR", "PROTEINUR", "UROBILINOGEN", "NITRITE", "LEUKOCYTESUR"  Radiological Exams on Admission: I have personally reviewed images CT Angio Chest/Abd/Pel for Dissection W and/or Wo Contrast  Result Date: 03/12/2022 CLINICAL DATA:  Chest pain, acute aortic syndrome suspected EXAM: CT ANGIOGRAPHY CHEST, ABDOMEN AND PELVIS TECHNIQUE: Non-contrast CT of the chest was initially obtained. Multidetector CT imaging through the chest, abdomen and pelvis was performed using the standard protocol during bolus administration of intravenous contrast. Multiplanar reconstructed images and MIPs were obtained and reviewed to evaluate the vascular anatomy. RADIATION DOSE REDUCTION: This exam was performed according to the departmental dose-optimization program which includes automated exposure control,  adjustment of the mA and/or kV according to patient size and/or use of iterative reconstruction technique. CONTRAST:  154m OMNIPAQUE IOHEXOL 350 MG/ML SOLN COMPARISON:  CT abdomen/pelvis dated 12/25/2006 FINDINGS: CTA CHEST FINDINGS Cardiovascular: On unenhanced CT, there is no evidence of intramural hematoma. Following contrast administration, there is no evidence of thoracic aortic aneurysm or dissection. Atherosclerotic calcifications of the arch. Although not tailored for evaluation of the pulmonary arteries, there is no evidence of central pulmonary embolism. Heart is normal in size.  No pericardial effusion. Mild coronary atherosclerosis of the LAD. Mediastinum/Nodes: No suspicious mediastinal lymphadenopathy. Visualized thyroid is unremarkable. Lungs/Pleura: 4 mm subpleural nodule in the posterior right upper lobe (series 6/image 22), likely benign. Mild lingular scarring/atelectasis.  No focal consolidation. No pleural effusion or pneumothorax. Musculoskeletal: Visualized osseous structures are within normal limits. Review of the MIP images confirms the above findings. CTA ABDOMEN AND PELVIS FINDINGS VASCULAR Aorta: No evidence abdominal aortic aneurysm or dissection. Atherosclerotic calcifications. Celiac: Patent.  Atherosclerotic calcifications at the origin. SMA: Patent.  Atherosclerotic calcifications at the origin. Renals: Patent bilaterally. Atherosclerotic calcifications at the origin. IMA: Patent. Inflow: Patent bilaterally.  Atherosclerotic calcifications. Veins: Unremarkable. Review of the MIP images confirms the above findings. NON-VASCULAR Hepatobiliary: Scattered hepatic cysts measuring up to 2.3 cm. Gallbladder is unremarkable. No intrahepatic or extrahepatic duct dilatation. Pancreas: Within normal limits Spleen: Within normal limits. Adrenals/Urinary Tract: Adrenal glands are within normal limits. Kidneys are within normal limits.  No hydronephrosis. Bladder is within normal limits.  Stomach/Bowel: Stomach is notable for a tiny hiatal hernia. No evidence of bowel obstruction. Normal appendix (series 7/image 29). No colonic wall thickening or inflammatory changes. Lymphatic: No suspicious abdominopelvic lymphadenopathy. Reproductive: Status post hysterectomy. Bilateral ovaries are within normal limits. Other: No abdominopelvic ascites. Musculoskeletal: Degenerative changes of the lumbar spine. Review of the MIP images confirms the above findings. IMPRESSION: No evidence of thoracoabdominal aortic aneurysm or dissection. No evidence of central pulmonary embolism. No acute findings in the chest, abdomen, or pelvis. 4 mm subpleural nodule in the posterior right upper lobe, likely benign. No follow-up needed if patient is low-risk.This recommendation follows the consensus statement: Guidelines for Management of Incidental Pulmonary Nodules Detected on CT Images: From the Fleischner Society 2017; Radiology 2017; 284:228-243. Electronically Signed   By: SJulian Hy  M.D.   On: 03/12/2022 21:27   DG Chest 2 View  Result Date: 03/12/2022 CLINICAL DATA:  Chest pain EXAM: CHEST - 2 VIEW COMPARISON:  None Available. FINDINGS: The heart size and mediastinal contours are within normal limits. Both lungs are clear. The visualized skeletal structures are unremarkable. IMPRESSION: No acute abnormality of the lungs. Electronically Signed   By: Delanna Ahmadi M.D.   On: 03/12/2022 13:52    EKG: My personal interpretation of EKG shows: NSR    Assessment/Plan Principal Problem:   Chest pain Active Problems:   Essential hypertension   Mixed hyperlipidemia    Assessment and Plan: * Chest pain Observation telemetry bed. Continue ASA 81 mg daily. Npo after MN. Check lipid panel. Pt states she can walk on treadmill. Schedule for exercise stress echo.  Mixed hyperlipidemia Check lipid panel. Continue 10 mg crestor.  Essential hypertension continue losartan 100 mg day. Pt states she does not  take lopressor anymore.   DVT prophylaxis: SQ Heparin Code Status: Full Code Family Communication: discussed with pt, husband and son at bedside  Disposition Plan: return home  Consults called: EDP has consulted cardiology([facility])  Admission status: Observation, Telemetry bed   Kristopher Oppenheim, DO [facility] 03/12/2022, 11:20 PM

## 2022-03-12 NOTE — ED Provider Notes (Signed)
[facility] EMERGENCY DEPARTMENT Provider Note   CSN: [id number] Arrival date & time: 03/12/22  1259     History  Chief Complaint  Patient presents with   Chest Pain    Andrea Owen is a 81 y.o. female.   Chest Pain  Patient is a 81 year old female with a history of HTN, HLD who presents to the emergency department for evaluation of chest pain.  According to the patient over the past 1 week she has been having lower chest aching which is not onset with exertion.  She has no associated shortness of breath, diaphoresis nausea or vomiting.  Patient states she was seen at [facility] urgent care yesterday at the request of a family friend telling her to go.  They performed an EKG at that time and told her to go to the emergency department, however patient states that she was feeling okay and thus went home.  A few hours ago for approximately 1 hour patient was having new worsened mid to lower chest discomfort which she described as a pressure on her chest.  She denied any associated shortness of breath, nausea or diaphoresis.  She denied any room spinning dizziness, lightheadedness or headaches.     Home Medications Prior to Admission medications   Medication Sig Start Date End Date Taking? Authorizing Provider  albuterol (VENTOLIN HFA) 108 (90 Base) MCG/ACT inhaler Inhale 2 puffs into the lungs every 6 (six) hours as needed for wheezing or shortness of breath.   Yes [provider]  Calcium-Magnesium (CALCIUM MAGNESIUM 750) 300-300 MG TABS Take 1 tablet by mouth daily.   Yes [provider]  Cholecalciferol (VITAMIN D) 50 MCG (2000 UT) tablet Take 2,000 Units by mouth daily.   Yes [provider]  losartan (COZAAR) 100 MG tablet Take 100 mg by mouth daily. 10/30/17  Yes [provider]  meclizine (ANTIVERT) 25 MG tablet Take 25 mg by mouth daily as needed for dizziness. 11/02/14  Yes [provider]  Multiple Vitamin  (MULTIVITAMIN WITH MINERALS) TABS tablet Take 1 tablet by mouth daily.   Yes [provider]  rosuvastatin (CRESTOR) 10 MG tablet Take 10 mg by mouth every evening. 10/11/17  Yes [provider]  methylPREDNISolone (MEDROL) 4 MG tablet Take 6 tablets on day 1, 5 tablets on day 2, 4 tablets on day 3, 3 tablets on day 4, 2 tablets on day 5, and 1 tablet on day 6 Patient not taking: Reported on 03/12/2022 01/11/18   Britt Bottom, MD  metoprolol tartrate (LOPRESSOR) 25 MG tablet TAKE 1 TABLET BY MOUTH 2 TIMES DAILY. Patient not taking: Reported on 03/12/2022 07/05/18   Britt Bottom, MD      Allergies    Atorvastatin, Pravastatin sodium, Rosuvastatin calcium, Zocor [simvastatin], and Codeine    Review of Systems   Review of Systems  Cardiovascular:  Positive for chest pain.    Physical Exam Updated Vital Signs BP (!) 181/53 (BP Location: Right Arm)   Pulse 67   Temp 98.4 F (36.9 C) (Oral)   Resp 17   Ht '5\' 3"'$  (1.6 m)   Wt 77.6 kg   SpO2 95%   BMI 30.29 kg/m  Physical Exam Vitals and nursing note reviewed.  Constitutional:      General: She is not in acute distress.    Appearance: She is well-developed.  HENT:     Head: Normocephalic and atraumatic.  Eyes:     Extraocular Movements: Extraocular movements intact.  Conjunctiva/sclera: Conjunctivae normal.     Pupils: Pupils are equal, round, and reactive to light.  Neck:     Vascular: No JVD.     Trachea: No tracheal deviation.  Cardiovascular:     Rate and Rhythm: Normal rate and regular rhythm.     Pulses:          Carotid pulses are 2+ on the right side and 2+ on the left side.      Radial pulses are 2+ on the right side and 2+ on the left side.       Dorsalis pedis pulses are 2+ on the right side and 2+ on the left side.       Posterior tibial pulses are 2+ on the right side and 2+ on the left side.     Heart sounds: No murmur heard. Pulmonary:     Effort: Pulmonary effort is normal. No  respiratory distress.     Breath sounds: Normal breath sounds. No wheezing, rhonchi or rales.  Abdominal:     Palpations: Abdomen is soft.     Tenderness: There is no abdominal tenderness.  Musculoskeletal:        General: No swelling.     Cervical back: Neck supple.     Right lower leg: No tenderness. No edema.     Left lower leg: No tenderness. No edema.  Skin:    General: Skin is warm and dry.     Capillary Refill: Capillary refill takes less than 2 seconds.     Findings: No rash.  Neurological:     General: No focal deficit present.     Mental Status: She is alert and oriented to person, place, and time.  Psychiatric:        Mood and Affect: Mood normal.     ED Results / Procedures / Treatments   Labs (all labs ordered are listed, but only abnormal results are displayed) Labs Reviewed  BASIC METABOLIC PANEL - Abnormal; Notable for the following components:      Result Value   Glucose, Bld 131 (*)    BUN 7 (*)    All other components within normal limits  TROPONIN I (HIGH SENSITIVITY) - Abnormal; Notable for the following components:   Troponin I (High Sensitivity) 53 (*)    All other components within normal limits  TROPONIN I (HIGH SENSITIVITY) - Abnormal; Notable for the following components:   Troponin I (High Sensitivity) 58 (*)    All other components within normal limits  CBC  LIPID PANEL  TROPONIN I (HIGH SENSITIVITY)  TROPONIN I (HIGH SENSITIVITY)    EKG EKG Interpretation  Date/Time:  Sunday March 12 2022 18:16:42 EDT Ventricular Rate:  69 PR Interval:  157 QRS Duration: 106 QT Interval:  415 QTC Calculation: 445 R Axis:   48 Text Interpretation: Sinus rhythm RSR' in V1 or V2, right VCD or RVH No significant change since last tracing Confirmed by Wandra Arthurs [id number]) on 03/12/2022 6:32:43 PM  Radiology CT Angio Chest/Abd/Pel for Dissection W and/or Wo Contrast  Result Date: 03/12/2022 CLINICAL DATA:  Chest pain, acute aortic syndrome suspected  EXAM: CT ANGIOGRAPHY CHEST, ABDOMEN AND PELVIS TECHNIQUE: Non-contrast CT of the chest was initially obtained. Multidetector CT imaging through the chest, abdomen and pelvis was performed using the standard protocol during bolus administration of intravenous contrast. Multiplanar reconstructed images and MIPs were obtained and reviewed to evaluate the vascular anatomy. RADIATION DOSE REDUCTION: This exam was performed according to the departmental dose-optimization  program which includes automated exposure control, adjustment of the mA and/or kV according to patient size and/or use of iterative reconstruction technique. CONTRAST:  139m OMNIPAQUE IOHEXOL 350 MG/ML SOLN COMPARISON:  CT abdomen/pelvis dated 12/25/2006 FINDINGS: CTA CHEST FINDINGS Cardiovascular: On unenhanced CT, there is no evidence of intramural hematoma. Following contrast administration, there is no evidence of thoracic aortic aneurysm or dissection. Atherosclerotic calcifications of the arch. Although not tailored for evaluation of the pulmonary arteries, there is no evidence of central pulmonary embolism. Heart is normal in size.  No pericardial effusion. Mild coronary atherosclerosis of the LAD. Mediastinum/Nodes: No suspicious mediastinal lymphadenopathy. Visualized thyroid is unremarkable. Lungs/Pleura: 4 mm subpleural nodule in the posterior right upper lobe (series 6/image 22), likely benign. Mild lingular scarring/atelectasis.  No focal consolidation. No pleural effusion or pneumothorax. Musculoskeletal: Visualized osseous structures are within normal limits. Review of the MIP images confirms the above findings. CTA ABDOMEN AND PELVIS FINDINGS VASCULAR Aorta: No evidence abdominal aortic aneurysm or dissection. Atherosclerotic calcifications. Celiac: Patent.  Atherosclerotic calcifications at the origin. SMA: Patent.  Atherosclerotic calcifications at the origin. Renals: Patent bilaterally. Atherosclerotic calcifications at the origin.  IMA: Patent. Inflow: Patent bilaterally.  Atherosclerotic calcifications. Veins: Unremarkable. Review of the MIP images confirms the above findings. NON-VASCULAR Hepatobiliary: Scattered hepatic cysts measuring up to 2.3 cm. Gallbladder is unremarkable. No intrahepatic or extrahepatic duct dilatation. Pancreas: Within normal limits Spleen: Within normal limits. Adrenals/Urinary Tract: Adrenal glands are within normal limits. Kidneys are within normal limits.  No hydronephrosis. Bladder is within normal limits. Stomach/Bowel: Stomach is notable for a tiny hiatal hernia. No evidence of bowel obstruction. Normal appendix (series 7/image 29). No colonic wall thickening or inflammatory changes. Lymphatic: No suspicious abdominopelvic lymphadenopathy. Reproductive: Status post hysterectomy. Bilateral ovaries are within normal limits. Other: No abdominopelvic ascites. Musculoskeletal: Degenerative changes of the lumbar spine. Review of the MIP images confirms the above findings. IMPRESSION: No evidence of thoracoabdominal aortic aneurysm or dissection. No evidence of central pulmonary embolism. No acute findings in the chest, abdomen, or pelvis. 4 mm subpleural nodule in the posterior right upper lobe, likely benign. No follow-up needed if patient is low-risk.This recommendation follows the consensus statement: Guidelines for Management of Incidental Pulmonary Nodules Detected on CT Images: From the Fleischner Society 2017; Radiology 2017; 284:228-243. Electronically Signed   By: SJulian HyM.D.   On: 03/12/2022 21:27   DG Chest 2 View  Result Date: 03/12/2022 CLINICAL DATA:  Chest pain EXAM: CHEST - 2 VIEW COMPARISON:  None Available. FINDINGS: The heart size and mediastinal contours are within normal limits. Both lungs are clear. The visualized skeletal structures are unremarkable. IMPRESSION: No acute abnormality of the lungs. Electronically Signed   By: ADelanna AhmadiM.D.   On: 03/12/2022 13:52     Procedures Procedures    Medications Ordered in ED Medications  hydrALAZINE (APRESOLINE) injection 10 mg (has no administration in time range)  hydrALAZINE (APRESOLINE) injection 10 mg (10 mg Intravenous Given 03/12/22 2036)  iohexol (OMNIPAQUE) 350 MG/ML injection 100 mL (100 mLs Intravenous Contrast Given 03/12/22 2116)  metoprolol tartrate (LOPRESSOR) injection 5 mg (5 mg Intravenous Given 03/12/22 2342)    ED Course/ Medical Decision Making/ A&P           HEART Score: 6                Medical Decision Making Problems Addressed: Chest pain, unspecified type: complicated acute illness or injury with systemic symptoms that poses a threat to life or bodily functions  Troponin level elevated: complicated acute illness or injury with systemic symptoms that poses a threat to life or bodily functions  Amount and/or Complexity of Data Reviewed Independent Historian: spouse External Data Reviewed: labs, radiology and notes. Labs: ordered. Radiology: ordered. ECG/medicine tests: ordered.  Risk Prescription drug management. Decision regarding hospitalization.   Patient is an 81 year old female who presents emergency department as above.  On initial presentation patient's vital signs are stable she is afebrile.  Physical exam for this patient was unremarkable however her blood pressure was elevated in the 200s.  For this reason she was given a dose of hydralazine as well as Lopressor.  Her pressures did improve thereafter.  Based on the patient's moderately suspicious score, her age as well as more than 2 risk factors and troponin 53 her heart score was 6 and was felt moderate risk.  In the setting of her elevated troponin and elevated blood pressure underlying concerns remain regarding the possibility of possible aortic dissection.  For this reason a CT angio of the chest abdomen pelvis were ordered.  This ultimately resulted as being unremarkable for acute dissection.  Upon returning from  the dissection study her repeat troponin resulted at 58.  Her cardiologist Dr Adrian Prows was contacted who felt that the patient may be appropriate for admission and possible stress testing.  In the absence of current chest pain and stabilization of her troponins heparin was ultimately not given.  He felt that in the absence of the patient's current chest pain and the seemingly stabilization of her troponins that the hospitalist team could admit her.  For this reason hospitalist team was contacted for admission for this patient.  Plan and findings discussed with patient and family at bedside and they verbalized understanding were agreeable.  Please see inpatient provider notes for additional details regarding the patient's ongoing hospital course and management.        Final Clinical Impression(s) / ED Diagnoses Final diagnoses:  Troponin level elevated  Chest pain, unspecified type    Rx / DC Orders ED Discharge Orders     None         Nelta Numbers, MD 03/13/22 0112    Drenda Freeze, MD 03/21/22 [phone]

## 2022-03-12 NOTE — Assessment & Plan Note (Signed)
continue losartan 100 mg day. Pt states she does not take lopressor anymore.

## 2022-03-12 NOTE — Assessment & Plan Note (Signed)
Observation telemetry bed. Continue ASA 81 mg daily. Npo after MN. Check lipid panel. Pt states she can walk on treadmill. Schedule for exercise stress echo.

## 2022-03-12 NOTE — ED Triage Notes (Signed)
CP x 1 week but got worse for an hour today.  Denies n/v/sob/diziness/

## 2022-03-13 ENCOUNTER — Observation Stay (HOSPITAL_COMMUNITY): Payer: PPO

## 2022-03-13 DIAGNOSIS — I10 Essential (primary) hypertension: Secondary | ICD-10-CM | POA: Diagnosis not present

## 2022-03-13 DIAGNOSIS — E782 Mixed hyperlipidemia: Secondary | ICD-10-CM | POA: Diagnosis not present

## 2022-03-13 DIAGNOSIS — I700 Atherosclerosis of aorta: Secondary | ICD-10-CM | POA: Diagnosis not present

## 2022-03-13 DIAGNOSIS — I2510 Atherosclerotic heart disease of native coronary artery without angina pectoris: Secondary | ICD-10-CM | POA: Diagnosis not present

## 2022-03-13 DIAGNOSIS — E7800 Pure hypercholesterolemia, unspecified: Secondary | ICD-10-CM | POA: Diagnosis not present

## 2022-03-13 DIAGNOSIS — R079 Chest pain, unspecified: Secondary | ICD-10-CM | POA: Diagnosis not present

## 2022-03-13 DIAGNOSIS — R748 Abnormal levels of other serum enzymes: Secondary | ICD-10-CM | POA: Diagnosis not present

## 2022-03-13 LAB — CBC WITH DIFFERENTIAL/PLATELET
Abs Immature Granulocytes: 0.02 10*3/uL (ref 0.00–0.07)
Basophils Absolute: 0.1 10*3/uL (ref 0.0–0.1)
Basophils Relative: 1 %
Eosinophils Absolute: 0 10*3/uL (ref 0.0–0.5)
Eosinophils Relative: 1 %
HCT: 41.3 % (ref 36.0–46.0)
Hemoglobin: 14.4 g/dL (ref 12.0–15.0)
Immature Granulocytes: 0 %
Lymphocytes Relative: 21 %
Lymphs Abs: 1.8 10*3/uL (ref 0.7–4.0)
MCH: 30 pg (ref 26.0–34.0)
MCHC: 34.9 g/dL (ref 30.0–36.0)
MCV: 86 fL (ref 80.0–100.0)
Monocytes Absolute: 0.5 10*3/uL (ref 0.1–1.0)
Monocytes Relative: 6 %
Neutro Abs: 6 10*3/uL (ref 1.7–7.7)
Neutrophils Relative %: 71 %
Platelets: 288 10*3/uL (ref 150–400)
RBC: 4.8 MIL/uL (ref 3.87–5.11)
RDW: 14.4 % (ref 11.5–15.5)
WBC: 8.4 10*3/uL (ref 4.0–10.5)
nRBC: 0 % (ref 0.0–0.2)

## 2022-03-13 LAB — COMPREHENSIVE METABOLIC PANEL
ALT: 19 U/L (ref 0–44)
AST: 23 U/L (ref 15–41)
Albumin: 3.6 g/dL (ref 3.5–5.0)
Alkaline Phosphatase: 36 U/L — ABNORMAL LOW (ref 38–126)
Anion gap: 10 (ref 5–15)
BUN: 10 mg/dL (ref 8–23)
CO2: 25 mmol/L (ref 22–32)
Calcium: 9.5 mg/dL (ref 8.9–10.3)
Chloride: 102 mmol/L (ref 98–111)
Creatinine, Ser: 0.74 mg/dL (ref 0.44–1.00)
GFR, Estimated: >60 mL/min (ref >60)
Glucose, Bld: 117 mg/dL — ABNORMAL HIGH (ref 70–99)
Potassium: 4 mmol/L (ref 3.5–5.1)
Sodium: 137 mmol/L (ref 135–145)
Total Bilirubin: 0.6 mg/dL (ref 0.3–1.2)
Total Protein: 6.4 g/dL — ABNORMAL LOW (ref 6.5–8.1)

## 2022-03-13 LAB — TROPONIN I (HIGH SENSITIVITY): Troponin I (High Sensitivity): 36 ng/L — ABNORMAL HIGH (ref <18)

## 2022-03-13 MED ORDER — LOSARTAN POTASSIUM 50 MG PO TABS
100.0000 mg | ORAL_TABLET | Freq: Every day | ORAL | Status: DC
  Administered 2022-03-13 – 2022-03-14 (×2): 100 mg via ORAL
  Filled 2022-03-13 (×2): qty 2

## 2022-03-13 MED ORDER — ONDANSETRON HCL 4 MG PO TABS: 4.0000 mg | ORAL_TABLET | Freq: Four times a day (QID) | ORAL | Status: DC | PRN

## 2022-03-13 MED ORDER — REGADENOSON 0.4 MG/5ML IV SOLN
0.4000 mg | Freq: Once | INTRAVENOUS | Status: DC
  Filled 2022-03-13: qty 5

## 2022-03-13 MED ORDER — TECHNETIUM TC 99M TETROFOSMIN IV KIT
10.2000 | PACK | Freq: Once | INTRAVENOUS | Status: AC | PRN
  Administered 2022-03-13: 10.2 via INTRAVENOUS

## 2022-03-13 MED ORDER — ACETAMINOPHEN 650 MG RE SUPP: 650.0000 mg | Freq: Four times a day (QID) | RECTAL | Status: DC | PRN

## 2022-03-13 MED ORDER — HEPARIN SODIUM (PORCINE) 5000 UNIT/ML IJ SOLN
5000.0000 [IU] | Freq: Three times a day (TID) | INTRAMUSCULAR | Status: DC
Start: 2022-03-13 — End: 2022-03-14
  Administered 2022-03-13 – 2022-03-14 (×5): 5000 [IU] via SUBCUTANEOUS
  Filled 2022-03-13 (×5): qty 1

## 2022-03-13 MED ORDER — REGADENOSON 0.4 MG/5ML IV SOLN
INTRAVENOUS | Status: AC
  Filled 2022-03-13: qty 5

## 2022-03-13 MED ORDER — SODIUM CHLORIDE 0.9 % IV SOLN: INTRAVENOUS | Status: DC

## 2022-03-13 MED ORDER — ONDANSETRON HCL 4 MG/2ML IJ SOLN: 4.0000 mg | Freq: Four times a day (QID) | INTRAMUSCULAR | Status: DC | PRN

## 2022-03-13 MED ORDER — ROSUVASTATIN CALCIUM 5 MG PO TABS
10.0000 mg | ORAL_TABLET | Freq: Every evening | ORAL | Status: DC
  Administered 2022-03-13: 10 mg via ORAL
  Filled 2022-03-13: qty 2

## 2022-03-13 MED ORDER — ACETAMINOPHEN 325 MG PO TABS
650.0000 mg | ORAL_TABLET | Freq: Four times a day (QID) | ORAL | Status: DC | PRN
  Administered 2022-03-13 (×2): 650 mg via ORAL
  Filled 2022-03-13 (×3): qty 2

## 2022-03-13 MED ORDER — CLONIDINE HCL 0.1 MG PO TABS: 0.1000 mg | ORAL_TABLET | ORAL | Status: DC | PRN

## 2022-03-13 MED ORDER — IOHEXOL 350 MG/ML SOLN
100.0000 mL | Freq: Once | INTRAVENOUS | Status: AC | PRN
  Administered 2022-03-13: 100 mL via INTRAVENOUS

## 2022-03-13 MED ORDER — HYDRALAZINE HCL 20 MG/ML IJ SOLN
10.0000 mg | Freq: Once | INTRAMUSCULAR | Status: AC
  Administered 2022-03-13: 10 mg via INTRAVENOUS
  Filled 2022-03-13: qty 1

## 2022-03-13 MED ORDER — NITROGLYCERIN 0.4 MG SL SUBL
SUBLINGUAL_TABLET | SUBLINGUAL | Status: AC
  Filled 2022-03-13: qty 2

## 2022-03-13 MED ORDER — NITROGLYCERIN 0.4 MG SL SUBL
0.8000 mg | SUBLINGUAL_TABLET | Freq: Once | SUBLINGUAL | Status: AC
  Administered 2022-03-13: 0.8 mg via SUBLINGUAL

## 2022-03-13 MED ORDER — METOPROLOL TARTRATE 25 MG PO TABS
25.0000 mg | ORAL_TABLET | Freq: Two times a day (BID) | ORAL | Status: DC
  Administered 2022-03-13 – 2022-03-14 (×3): 25 mg via ORAL
  Filled 2022-03-13 (×3): qty 1

## 2022-03-13 MED ORDER — MELATONIN 5 MG PO TABS
10.0000 mg | ORAL_TABLET | Freq: Every evening | ORAL | Status: DC | PRN
  Administered 2022-03-13: 10 mg via ORAL
  Filled 2022-03-13: qty 2

## 2022-03-13 NOTE — ED Notes (Signed)
Pt off the floor

## 2022-03-13 NOTE — Consult Note (Signed)
CARDIOLOGY CONSULT NOTE  Patient ID: Andrea Owen MRN: [id number] DOB/AGE: 83-14-1942 83 y.o.  Admit date: 03/12/2022 Referring Physician Dr. Eleonore Chiquito Primary Physician:  Merrilee Seashore, MD Reason for Consultation:chest pain and abnormal serum troponin  Patient ID: Andrea Owen, female    DOB: May 02, 1941, 81 y.o.   MRN: [id number]  Chief Complaint  Patient presents with   Chest Pain   HPI:    Andrea Owen  is a 81 y.o. Caucasian female patient with hypertension, hyperlipidemia, started having chest pain in the form of chest tightness and heaviness in the middle of the chest about a week ago, described as mild.  Symptoms persisted, on the weekend one of her friends who is a physician told her that she needs to have her checked out.  She went to an urgent care where an EKG was performed and she was advised to directly go to the emergency room on a urgent basis.  Patient states that she has not had any further recurrence of chest pain.  Chest pain ongoing for the past 1 week continuously and sometimes worse on taking deep breath.  She has continued to do her usual activities without any limitations.  No hemoptysis, no leg edema, no PND or orthopnea or dyspnea on exertion.  Past Medical History:  Diagnosis Date   Hypertension    Vision abnormalities    Past Surgical History:  Procedure Laterality Date   MASTECTOMY Bilateral    MENISCUS REPAIR Right    PARTIAL HYSTERECTOMY     Social History   Tobacco Use   Smoking status: Never   Smokeless tobacco: Never  Substance Use Topics   Alcohol use: Never    Family History  Problem Relation Age of Onset   Colon cancer Mother    Heart attack Father    Congestive Heart Failure Brother    Other Brother    Deafness Brother    Heart disease Brother    Aneurysm Brother    Aneurysm Brother     Marital Status: Married  ROS  Review of Systems  Cardiovascular:  Positive for chest pain. Negative for dyspnea on exertion and leg  swelling.   Objective      03/13/2022    8:00 AM 03/13/2022    7:00 AM 03/13/2022    6:00 AM  Vitals with BMI  Systolic 182 993 716  Diastolic 62 62 59  Pulse 68 66 66    Blood pressure (!) 172/62, pulse 68, temperature 98 F (36.7 C), resp. rate 12, height '5\' 3"'$  (1.6 m), weight 77.6 kg, SpO2 96 %.    Physical Exam Neck:     Vascular: No JVD.  Cardiovascular:     Rate and Rhythm: Normal rate and regular rhythm.     Pulses: Intact distal pulses.     Heart sounds: Normal heart sounds. No murmur heard.    No gallop.  Pulmonary:     Effort: Pulmonary effort is normal.     Breath sounds: Normal breath sounds.  Abdominal:     General: Bowel sounds are normal.     Palpations: Abdomen is soft.  Musculoskeletal:     Right lower leg: No edema.     Left lower leg: No edema.    Laboratory examination:   Recent Labs    03/12/22 1322 03/13/22 0540  NA 139 137  K 4.0 4.0  CL 104 102  CO2 27 25  GLUCOSE 131* 117*  BUN 7* 10  CREATININE 0.78  0.74  CALCIUM 9.8 9.5  GFRNONAA >60 >60   estimated creatinine clearance is 52.6 mL/min (by C-G formula based on SCr of 0.74 mg/dL).     Latest Ref Rng & Units 03/13/2022    5:40 AM 03/12/2022    1:22 PM  CMP  Glucose 70 - 99 mg/dL 117  131   BUN 8 - 23 mg/dL 10  7   Creatinine 0.44 - 1.00 mg/dL 0.74  0.78   Sodium 135 - 145 mmol/L 137  139   Potassium 3.5 - 5.1 mmol/L 4.0  4.0   Chloride 98 - 111 mmol/L 102  104   CO2 22 - 32 mmol/L 25  27   Calcium 8.9 - 10.3 mg/dL 9.5  9.8   Total Protein 6.5 - 8.1 g/dL 6.4    Total Bilirubin 0.3 - 1.2 mg/dL 0.6    Alkaline Phos 38 - 126 U/L 36    AST 15 - 41 U/L 23    ALT 0 - 44 U/L 19        Latest Ref Rng & Units 03/13/2022    5:40 AM 03/12/2022    1:22 PM  CBC  WBC 4.0 - 10.5 K/uL 8.4  5.9   Hemoglobin 12.0 - 15.0 g/dL 14.4  14.6   Hematocrit 36.0 - 46.0 % 41.3  42.4   Platelets 150 - 400 K/uL 288  298    Lipid Panel No results for input(s): "CHOL", "TRIG", "[place]", "VLDL",  "HDL", "CHOLHDL", "LDLDIRECT" in the last 8760 hours.  HEMOGLOBIN A1C No results found for: "HGBA1C", "MPG" TSH No results for input(s): "TSH" in the last 8760 hours. BNP (last 3 results) No results for input(s): "BNP" in the last 8760 hours.  Cardiac Panel (last 3 results) Recent Labs    03/12/22 1630 03/12/22 2040 03/13/22 0540  TROPONINIHS 53* 58* 36*    Medications and allergies   Allergies  Allergen Reactions   Atorvastatin     Other reaction(s): myalgia   Pravastatin Sodium     Other reaction(s): myalgia   Rosuvastatin Calcium     Other reaction(s): myalgia   Zocor [Simvastatin]     Other reaction(s): myalgia   Codeine Rash     Current Meds  Medication Sig   albuterol (VENTOLIN HFA) 108 (90 Base) MCG/ACT inhaler Inhale 2 puffs into the lungs every 6 (six) hours as needed for wheezing or shortness of breath.   Calcium-Magnesium (CALCIUM MAGNESIUM 750) 300-300 MG TABS Take 1 tablet by mouth daily.   Cholecalciferol (VITAMIN D) 50 MCG (2000 UT) tablet Take 2,000 Units by mouth daily.   losartan (COZAAR) 100 MG tablet Take 100 mg by mouth daily.   meclizine (ANTIVERT) 25 MG tablet Take 25 mg by mouth daily as needed for dizziness.   Multiple Vitamin (MULTIVITAMIN WITH MINERALS) TABS tablet Take 1 tablet by mouth daily.   rosuvastatin (CRESTOR) 10 MG tablet Take 10 mg by mouth every evening.    Scheduled Meds:  heparin  5,000 Units Subcutaneous Q8H   losartan  100 mg Oral Daily   rosuvastatin  10 mg Oral QPM   Continuous Infusions: PRN Meds:.acetaminophen **OR** acetaminophen, cloNIDine, melatonin, ondansetron **OR** ondansetron (ZOFRAN) IV   Radiology:   CT angiogram of the chest and abdomen 03/12/2022: No evidence of thoracic aortic aneurysm or dissection.  Atherosclerotic calcification of the arch.  No evidence of central pulmonary embolism. Mild coronary atherosclerosis of the LAD. Mild lingular scarring/atelectasis. No evidence of renal artery stenosis. 4  mm subpleural  nodule in the posterior right upper lobe likely benign.  Chest x-ray two-view 03/12/2022 The heart size and mediastinal contours are within normal limits. Both lungs are clear. The visualized skeletal structures are unremarkable.   Cardiac Studies:   Echocardiogram 09/27/2016: 1. Left ventricle cavity is normal in size. Normal global wall motion. Normal diastolic filling pattern, normal LAP. Calculated EF 65%. 2. Mild to moderate tricuspid regurgitation. Mild pulmonary hypertension. Pulmonary artery systolic pressure is estimated at 36 mm Hg.  EKG:  03/12/2022: Normal sinus rhythm at rate of 69 bpm.  No evidence of ischemia.  Assessment   1.  Chest pain most probably related to uncontrolled hypertension. 2.  Abnormal cardiac markers, type II MI, do not suspect ACS. 3.  Hypertension 4.  Mixed hypercholesterolemia  Recommendations:   Chest pain symptoms ongoing for the past 1 week, she has continued to do all her physical activities in spite of the chest pain, also has a pleuritic component with pain slightly worsening with deep inspiration.  However being retrosternal chest pain, diuresis risk factors mildly abnormal troponins, she is scheduled for stress echocardiogram which we do not do here hence I scheduled it for Lexiscan nuclear stress test.  However the nuclear scanner is broken today, patient has already received rest dose of the nuclear scan, will be nondiagnostic.  We have an opening for coronary CTA today will proceed with the same and if normal can be discharged home safely.  We will add amlodipine 5 mg in the morning and continue losartan 100 mg in the evening for uncontrolled hypertension and follow-up in the outpatient basis.  She is on statins for lipids, continue the same.  Will await coronary CTA results.  Thank you for the consultation.   Adrian Prows, MD, [facility] 03/13/2022, 8:41 AM Office: [phone]

## 2022-03-13 NOTE — Progress Notes (Signed)
[facility]  PROGRESS NOTE  Andrea Owen ZOX:[id number] DOB: 10/11/1940 DOA: 03/12/2022 PCP: Merrilee Seashore, MD   Brief HPI:   81 year old female with history of hypertension, hyperlipidemia came to ED with 7 days of chest pain.  EKG showed normal sinus rhythm with no ST changes.  Troponin was 11, 53, 58.    Subjective   Patient denies chest pain at this time.  Says that her blood pressure was elevated to 230 at home.   Assessment/Plan:     Chest pain -Likely in setting of uncontrolled hypertension -Started on aspirin 81 mg daily -Initially plan for nuclear stress test however test could not be completed so changed to CT coronary angiography -Result pending  Hypertension -Patient takes losartan 100 mg daily -Started on metoprolol 25 mg p.o. twice daily  Hyperlipidemia -Continue rosuvastatin    Medications     heparin  5,000 Units Subcutaneous Q8H   losartan  100 mg Oral Daily   metoprolol tartrate  25 mg Oral BID   nitroGLYCERIN       regadenoson       rosuvastatin  10 mg Oral QPM     Data Reviewed:   CBG:  No results for input(s): "GLUCAP" in the last 168 hours.  SpO2: 98 %    Vitals:   03/13/22 1307 03/13/22 1327 03/13/22 1400 03/13/22 1605  BP: (!) 157/96  (!) 166/68 (!) 146/67  Pulse: 78  (!) 52 62  Resp:   16 18  Temp:  97.7 F (36.5 C)  98.7 F (37.1 C)  TempSrc:    Oral  SpO2:   94% 98%  Weight:    76.9 kg  Height:    '5\' 2"'$  (1.575 m)      Data Reviewed:  Basic Metabolic Panel: Recent Labs  Lab 03/12/22 1322 03/13/22 0540  NA 139 137  K 4.0 4.0  CL 104 102  CO2 27 25  GLUCOSE 131* 117*  BUN 7* 10  CREATININE 0.78 0.74  CALCIUM 9.8 9.5    CBC: Recent Labs  Lab 03/12/22 1322 03/13/22 0540  WBC 5.9 8.4  NEUTROABS  --  6.0  HGB 14.6 14.4  HCT 42.4 41.3  MCV 86.9 86.0  PLT 298 288    LFT Recent Labs  Lab 03/13/22 0540  AST 23  ALT 19  ALKPHOS 36*  BILITOT 0.6  PROT 6.4*  ALBUMIN 3.6      Antibiotics: Anti-infectives (From admission, onward)    None        DVT prophylaxis: Heparin  Code Status: Full code  Family Communication: No family at bedside   CONSULTS cardiology   Objective    Physical Examination:   General-appears in no acute distress Heart-S1-S2, regular, no murmur auscultated Lungs-clear to auscultation bilaterally, no wheezing or crackles auscultated Abdomen-soft, nontender, no organomegaly Extremities-no edema in the lower extremities Neuro-alert, oriented x3, no focal deficit noted  Status is: Inpatient:  \           [facility] If 7PM-7AM, please contact night-coverage at [email or web address], Office  [phone]   03/13/2022, 6:04 PM  LOS: 0 days

## 2022-03-13 NOTE — ED Notes (Signed)
PT transported to Nuclear Medicine

## 2022-03-13 NOTE — ED Notes (Signed)
Received call from Nuclear Medicine spoke to [place]. Confirmed NPO. Ms. Christianson will be going for stress test this am after orders placed.

## 2022-03-13 NOTE — ED Notes (Signed)
Report given to Katrina

## 2022-03-13 NOTE — TOC Initial Note (Signed)
Transition of Care [facility]) - Initial/Assessment Note    Patient Details  Name: Andrea Owen MRN: [id number] Date of Birth: 1940-10-29  Transition of Care [facility]) CM/SW Contact:    Zenon Mayo, RN Phone Number: 03/13/2022, 4:12 PM  Clinical Narrative:                 From home, chest pain, conts on hep drip, TOC following.         Patient Goals and CMS Choice        Expected Discharge Plan and Services                                                Prior Living Arrangements/Services                       Activities of Daily Living      Permission Sought/Granted                  Emotional Assessment              Admission diagnosis:  Troponin level elevated [R77.8] Chest pain [R07.9] Chest pain, unspecified type [R07.9] Patient Active Problem List   Diagnosis Date Noted   Chest pain 03/12/2022   Essential hypertension 03/12/2022   Mixed hyperlipidemia 03/12/2022   Subcortical microvascular ischemic occlusive disease 12/13/2017   Visual loss 10/31/2017   Nonintractable episodic headache 10/11/2017   PCP:  Merrilee Seashore, MD Pharmacy:   [facility], [place] [address] Phone: [phone] Fax: [phone]     Social Determinants of Health (SDOH) Interventions    Readmission Risk Interventions     No data to display

## 2022-03-14 DIAGNOSIS — I10 Essential (primary) hypertension: Secondary | ICD-10-CM | POA: Diagnosis not present

## 2022-03-14 DIAGNOSIS — E782 Mixed hyperlipidemia: Secondary | ICD-10-CM | POA: Diagnosis not present

## 2022-03-14 DIAGNOSIS — R079 Chest pain, unspecified: Secondary | ICD-10-CM | POA: Diagnosis not present

## 2022-03-14 MED ORDER — AMLODIPINE BESYLATE 5 MG PO TABS: 5.0000 mg | ORAL_TABLET | Freq: Every day | ORAL | 3 refills | Status: DC

## 2022-03-14 MED ORDER — METOPROLOL TARTRATE 25 MG PO TABS: 25.0000 mg | ORAL_TABLET | Freq: Two times a day (BID) | ORAL | 5 refills | Status: DC

## 2022-03-14 MED ORDER — AMLODIPINE BESYLATE 5 MG PO TABS
5.0000 mg | ORAL_TABLET | Freq: Every day | ORAL | Status: DC
  Administered 2022-03-14: 5 mg via ORAL
  Filled 2022-03-14 (×2): qty 1

## 2022-03-14 NOTE — TOC Transition Note (Signed)
Transition of Care [facility]) - CM/SW Discharge Note   Patient Details  Name: [name] MRN: [id number] Date of Birth: 01/10/1941  Transition of Care [facility]) CM/SW Contact:  Zenon Mayo, RN Phone Number: 03/14/2022, 11:15 AM   Clinical Narrative:    Patient is for dc today, she states her spouse will transport her home after she eats lunch , she has been npo so she wants something to eat before she goes home.  She has no needs.          Patient Goals and CMS Choice        Discharge Placement                       Discharge Plan and Services                                     Social Determinants of Health (SDOH) Interventions     Readmission Risk Interventions     No data to display

## 2022-03-14 NOTE — Care Management Obs Status (Signed)
[place] NOTIFICATION   Patient Details  Name: Andrea Owen MRN: [id number] Date of Birth: 10-Apr-1941   Medicare Observation Status Notification Given:  Yes    Zenon Mayo, RN 03/14/2022, 11:07 AM

## 2022-03-14 NOTE — Progress Notes (Signed)
Pt discharged home in stable condition. Discharge instructions given. Script sent to pharmacy of choice. No immediate questions or concerns at this time. Discharged from unit via wheelchair.

## 2022-03-14 NOTE — Progress Notes (Signed)
Mobility Specialist - Progress Note   03/14/22 1100  Mobility  Activity Ambulated independently in hallway  Level of Assistance Independent  Assistive Device None  Distance Ambulated (ft) 550 ft  Activity Response Tolerated well  $Mobility charge 1 Mobility   Pt received in bed and agreeable to mobility. Pt returned to recliner with call bell within reach and all needs met.   Paulla Dolly Mobility Specialist

## 2022-03-14 NOTE — Discharge Summary (Signed)
Physician Discharge Summary   Patient: Andrea Owen MRN: [id number] DOB: 11-Aug-1941  Admit date:     03/12/2022  Discharge date: 03/14/22  Discharge Physician: Oswald Hillock   PCP: Merrilee Seashore, MD   Recommendations at discharge:   Follow-up cardiology as outpatient  Discharge Diagnoses: Principal Problem:   Chest pain Active Problems:   Essential hypertension   Mixed hyperlipidemia  Resolved Problems:   * No resolved hospital problems. *  Hospital Course: 81 year old female with history of hypertension, hyperlipidemia came to ED with 7 days of chest pain.  EKG showed normal sinus rhythm with no ST changes.  Troponin was 11, 53, 58.    Assessment and Plan:  Chest pain -Likely in setting of uncontrolled hypertension -Started on aspirin 81 mg daily -Initially plan for nuclear stress test however test could not be completed so changed to CT coronary angiography -CT angiography  showed mild disease -Cardiology Dr. Einar Gip has cleared patient for discharge   Hypertension -Patient takes losartan 100 mg daily -Started on metoprolol 25 mg p.o. twice daily which patient was taking at home -Blood pressure still elevated, will add amlodipine 5 mg daily -Follow-up with Dr. Einar Gip as outpatient for further adjustment of medications.   Hyperlipidemia -Continue rosuvastatin          Consultants: Cardiology Procedures performed: Coronary CTA Disposition: Home Diet recommendation:  Discharge Diet Orders (From admission, onward)     Start     Ordered   03/14/22 0000  Diet - low sodium heart healthy        03/14/22 1056           Cardiac diet DISCHARGE MEDICATION: Allergies as of 03/14/2022       Reactions   Atorvastatin    Other reaction(s): myalgia   Pravastatin Sodium    Other reaction(s): myalgia   Rosuvastatin Calcium    Other reaction(s): myalgia   Zocor [simvastatin]    Other reaction(s): myalgia   Codeine Rash        Medication List      STOP taking these medications    methylPREDNISolone 4 MG tablet Commonly known as: MEDROL       TAKE these medications    albuterol 108 (90 Base) MCG/ACT inhaler Commonly known as: VENTOLIN HFA Inhale 2 puffs into the lungs every 6 (six) hours as needed for wheezing or shortness of breath.   amLODipine 5 MG tablet Commonly known as: NORVASC Take 1 tablet (5 mg total) by mouth daily after breakfast.   Calcium Magnesium 750 300-300 MG Tabs Generic drug: Calcium-Magnesium Take 1 tablet by mouth daily.   losartan 100 MG tablet Commonly known as: COZAAR Take 100 mg by mouth daily.   meclizine 25 MG tablet Commonly known as: ANTIVERT Take 25 mg by mouth daily as needed for dizziness.   metoprolol tartrate 25 MG tablet Commonly known as: LOPRESSOR Take 1 tablet (25 mg total) by mouth 2 (two) times daily.   multivitamin with minerals Tabs tablet Take 1 tablet by mouth daily.   rosuvastatin 10 MG tablet Commonly known as: CRESTOR Take 10 mg by mouth every evening.   Vitamin D 50 MCG (2000 UT) tablet Take 2,000 Units by mouth daily.        Discharge Exam: Filed Weights   03/12/22 1304 03/13/22 1605 03/14/22 0552  Weight: 77.6 kg 76.9 kg 78 kg   General-appears in no acute distress Heart-S1-S2, regular, no murmur auscultated Lungs-clear to auscultation bilaterally, no wheezing or crackles auscultated Abdomen-soft,  nontender, no organomegaly Extremities-no edema in the lower extremities Neuro-alert, oriented x3, no focal deficit noted  Condition at discharge: good  The results of significant diagnostics from this hospitalization (including imaging, microbiology, ancillary and laboratory) are listed below for reference.   Imaging Studies: CT CORONARY MORPH W/CTA COR W/SCORE W/CA W/CM &/OR WO/CM  Addendum Date: 03/13/2022   ADDENDUM REPORT: 03/13/2022 19:14 HISTORY: Chest pain/anginal equiv, low CAD risk, not treadmill candidate EXAM: Cardiac/Coronary  CT  TECHNIQUE: The patient was scanned on a [employer]. PROTOCOL: A 120 kV prospective scan was triggered in the descending thoracic aorta at 111 HU's. Axial non-contrast 3 mm slices were carried out through the heart. The data set was analyzed on a dedicated work station and scored using the [place]. Gantry rotation speed was 250 msecs and collimation was .6 mm. No IV beta blockade but 0.8 mg of sl NTG was given. The 3D data set was reconstructed in 5% intervals of the 35-75 % of the R-R cycle. Diastolic phases were analyzed on a dedicated work station using MPR, MIP and VRT modes. The patient received 137m OMNIPAQUE IOHEXOL 350 MG/ML SOLN of contrast. FINDINGS: Image quality: Poor. Artifact: Severe.  (Slab artifact). Coronary artery calcification score: Left main: 29.3 Left anterior descending artery: 45.8 Left circumflex artery: 0 Right coronary artery: 0 Total coronary calcium score of 75.1, places the patient at the 36th percentile for age and sex matched control. Coronary arteries: Normal coronary origins.  Left dominance. Left Main Coronary Artery: The left main is a normal caliber vessel with a normal take off from the left coronary cusp that bifurcates to form a left anterior descending artery and a left circumflex artery. Proximal to mid segment patent. Distal segment mild stenosis (25-49%) due to calcified plaque. Left Anterior Descending Coronary Artery: Normal caliber vessel, wraps the apex, gives off 1 patent diagonal branches. Proximal LAD mild stenosis (25-49%) due to calcified plaque and mid to apical LAD is patent. First diagonal branch is patent. Left Circumflex Artery: Normal caliber vessel, dominant, travels within the atrioventricular groove, gives off 2 patent obtuse marginal branches and terminates as PDA and left posterolateral branch. The LCX is patent with no evidence of plaque or stenosis. First obtuse marginal branch is patent. Second obtuse marginal branch is normal  caliber, large size, bifurcates into superior and inferior branches the vessel is grossly patent (ostial to mid segment not well visualized due to artifact). Right Coronary Artery: Non-dominant with normal take off from the right coronary cusp. No evidence of plaque or stenosis, accuracy may be limited to artifact. Left Atrium: Grossly normal in size with no left atrial appendage filling defect. Left Ventricle: Grossly normal in size. There are no stigmata of prior infarction. There is no abnormal filling defect. Pulmonary arteries: Normal in size without proximal filling defect. Pulmonary veins: Normal pulmonary venous drainage. Aorta: Normal size, 31.1 mm at the mid ascending aorta (level of the PA bifurcation) measured double oblique. Aortic atherosclerosis. No dissection. Pericardium: Normal thickness with no significant effusion or calcium present. Cardiac valves: The aortic valve is trileaflet with calcification. The mitral valve is normal structure without significant calcification. Extra-cardiac findings: See attached radiology report for non-cardiac structures. IMPRESSION: 1. Total coronary calcium score of 75.1. This was 36th percentile for age and sex matched control. 2. Normal coronary origin with left dominance. 3. CAD-RADS = 2 Mild non-obstructive CAD. Left Main: Distal segment mild stenosis (25-49%) due to calcified plaque. LAD: Proximal LAD mild stenosis (25-49%) due to  calcified plaque and mid to apical LAD is patent. LCx: Grossly patent. RCA: Non-dominant, grossly patent. 4. Aortic atherosclerosis. 5. Overall accuracy is limited to poor heart rate control and presence of slab artifact refer to the details noted above. RECOMMENDATIONS: Consider preventive therapy and risk factor modification. If chest pain continue despite uptitration of medical therapy consider a different modality to evaluate for ischemia. Clinical correlation required. Electronically Signed   By: Rex Kras D.O.   On:  03/13/2022 19:14   Result Date: 03/13/2022 EXAM: OVER-READ INTERPRETATION  CT CHEST The following report is a limited chest CT over-read performed by radiologist Dr. Abigail Miyamoto of [facility], [place] on 03/13/2022. This over-read does not include interpretation of cardiac or coronary anatomy or pathology. The coronary CTA interpretation by the cardiologist is attached. COMPARISON:  03/12/2022 CTA chest FINDINGS: Vascular: Aortic atherosclerosis. No central pulmonary embolism, on this non-dedicated study. Mediastinum/Nodes: No imaged thoracic adenopathy. Tiny hiatal hernia. Lungs/Pleura: No pleural fluid. Bibasilar scarring or subsegmental atelectasis. Upper Abdomen: Foci of arterial hyperenhancement within hepatic dome x2 measure 4-5 mm on 46/10 and are likely perfusion anomalies. Normal imaged portions of the spleen. Musculoskeletal: No acute osseous abnormality. IMPRESSION: No acute findings in the imaged extracardiac chest. Aortic Atherosclerosis (ICD10-I70.0). Tiny hiatal hernia. Electronically Signed: By: Abigail Miyamoto M.D. On: 03/13/2022 16:36   CT Angio Chest/Abd/Pel for Dissection W and/or Wo Contrast  Result Date: 03/12/2022 CLINICAL DATA:  Chest pain, acute aortic syndrome suspected EXAM: CT ANGIOGRAPHY CHEST, ABDOMEN AND PELVIS TECHNIQUE: Non-contrast CT of the chest was initially obtained. Multidetector CT imaging through the chest, abdomen and pelvis was performed using the standard protocol during bolus administration of intravenous contrast. Multiplanar reconstructed images and MIPs were obtained and reviewed to evaluate the vascular anatomy. RADIATION DOSE REDUCTION: This exam was performed according to the departmental dose-optimization program which includes automated exposure control, adjustment of the mA and/or kV according to patient size and/or use of iterative reconstruction technique. CONTRAST:  122m OMNIPAQUE IOHEXOL 350 MG/ML SOLN COMPARISON:  CT abdomen/pelvis dated 12/25/2006  FINDINGS: CTA CHEST FINDINGS Cardiovascular: On unenhanced CT, there is no evidence of intramural hematoma. Following contrast administration, there is no evidence of thoracic aortic aneurysm or dissection. Atherosclerotic calcifications of the arch. Although not tailored for evaluation of the pulmonary arteries, there is no evidence of central pulmonary embolism. Heart is normal in size.  No pericardial effusion. Mild coronary atherosclerosis of the LAD. Mediastinum/Nodes: No suspicious mediastinal lymphadenopathy. Visualized thyroid is unremarkable. Lungs/Pleura: 4 mm subpleural nodule in the posterior right upper lobe (series 6/image 22), likely benign. Mild lingular scarring/atelectasis.  No focal consolidation. No pleural effusion or pneumothorax. Musculoskeletal: Visualized osseous structures are within normal limits. Review of the MIP images confirms the above findings. CTA ABDOMEN AND PELVIS FINDINGS VASCULAR Aorta: No evidence abdominal aortic aneurysm or dissection. Atherosclerotic calcifications. Celiac: Patent.  Atherosclerotic calcifications at the origin. SMA: Patent.  Atherosclerotic calcifications at the origin. Renals: Patent bilaterally. Atherosclerotic calcifications at the origin. IMA: Patent. Inflow: Patent bilaterally.  Atherosclerotic calcifications. Veins: Unremarkable. Review of the MIP images confirms the above findings. NON-VASCULAR Hepatobiliary: Scattered hepatic cysts measuring up to 2.3 cm. Gallbladder is unremarkable. No intrahepatic or extrahepatic duct dilatation. Pancreas: Within normal limits Spleen: Within normal limits. Adrenals/Urinary Tract: Adrenal glands are within normal limits. Kidneys are within normal limits.  No hydronephrosis. Bladder is within normal limits. Stomach/Bowel: Stomach is notable for a tiny hiatal hernia. No evidence of bowel obstruction. Normal appendix (series 7/image 29). No colonic wall thickening  or inflammatory changes. Lymphatic: No suspicious  abdominopelvic lymphadenopathy. Reproductive: Status post hysterectomy. Bilateral ovaries are within normal limits. Other: No abdominopelvic ascites. Musculoskeletal: Degenerative changes of the lumbar spine. Review of the MIP images confirms the above findings. IMPRESSION: No evidence of thoracoabdominal aortic aneurysm or dissection. No evidence of central pulmonary embolism. No acute findings in the chest, abdomen, or pelvis. 4 mm subpleural nodule in the posterior right upper lobe, likely benign. No follow-up needed if patient is low-risk.This recommendation follows the consensus statement: Guidelines for Management of Incidental Pulmonary Nodules Detected on CT Images: From the Fleischner Society 2017; Radiology 2017; 284:228-243. Electronically Signed   By: Julian Hy M.D.   On: 03/12/2022 21:27   DG Chest 2 View  Result Date: 03/12/2022 CLINICAL DATA:  Chest pain EXAM: CHEST - 2 VIEW COMPARISON:  None Available. FINDINGS: The heart size and mediastinal contours are within normal limits. Both lungs are clear. The visualized skeletal structures are unremarkable. IMPRESSION: No acute abnormality of the lungs. Electronically Signed   By: Delanna Ahmadi M.D.   On: 03/12/2022 13:52    Microbiology: No results found for this or any previous visit.  Labs: CBC: Recent Labs  Lab 03/12/22 1322 03/13/22 0540  WBC 5.9 8.4  NEUTROABS  --  6.0  HGB 14.6 14.4  HCT 42.4 41.3  MCV 86.9 86.0  PLT 298 371   Basic Metabolic Panel: Recent Labs  Lab 03/12/22 1322 03/13/22 0540  NA 139 137  K 4.0 4.0  CL 104 102  CO2 27 25  GLUCOSE 131* 117*  BUN 7* 10  CREATININE 0.78 0.74  CALCIUM 9.8 9.5   Liver Function Tests: Recent Labs  Lab 03/13/22 0540  AST 23  ALT 19  ALKPHOS 36*  BILITOT 0.6  PROT 6.4*  ALBUMIN 3.6   CBG: No results for input(s): "GLUCAP" in the last 168 hours.  Discharge time spent: greater than 30 minutes.  Signed: Oswald Hillock, MD [facility] 03/14/2022

## 2022-03-27 ENCOUNTER — Ambulatory Visit: Payer: PPO | Admitting: Cardiology

## 2022-03-27 ENCOUNTER — Encounter: Payer: Self-pay | Admitting: Cardiology

## 2022-03-27 VITALS — BP 178/72 | HR 66 | Temp 98.0°F | Resp 16 | Ht 62.0 in | Wt 173.6 lb

## 2022-03-27 DIAGNOSIS — I10 Essential (primary) hypertension: Secondary | ICD-10-CM

## 2022-03-27 DIAGNOSIS — E782 Mixed hyperlipidemia: Secondary | ICD-10-CM

## 2022-03-27 DIAGNOSIS — R0789 Other chest pain: Secondary | ICD-10-CM | POA: Diagnosis not present

## 2022-03-27 MED ORDER — METOPROLOL TARTRATE 25 MG PO TABS: 25.0000 mg | ORAL_TABLET | Freq: Two times a day (BID) | ORAL | 3 refills | Status: AC

## 2022-03-27 MED ORDER — AMLODIPINE BESYLATE 10 MG PO TABS: 10.0000 mg | ORAL_TABLET | Freq: Every day | ORAL | 3 refills | Status: AC

## 2022-03-27 NOTE — Progress Notes (Signed)
Primary Physician/Referring:  Merrilee Seashore, MD  Patient ID: Andrea Owen, female    DOB: 12-May-1941, 81 y.o.   MRN: [id number]  Chief Complaint  Patient presents with   Hospitalization Follow-up   New Patient (Initial Visit)   Hypertension   Hyperlipidemia   HPI:    Andrea Owen  is a 81 y.o. Patient presented to the emergency room on 03/12/2022 with chest pain, I did not think she was high risk from cardiac standpoint however patient was admitted overnight, due to nuclear camera being broken, she underwent coronary CTA which revealed only mild coronary calcification at low percentile and also minimal coronary atherosclerosis.    Fortunately she has not had any further episodes of chest pain.  Blood pressure continues to remain elevated which was noted in the emergency room and felt her chest pain was related to uncontrolled hypertension.  Patient states that since hospital discharge she has been lifestyle changes with regard to her diet and also has been walking on a daily basis and has not had any recurrence of chest pain.  Essentially remains asymptomatic.  Past Medical History:  Diagnosis Date   Hyperlipidemia    Hypertension    Vision abnormalities    Past Surgical History:  Procedure Laterality Date   MASTECTOMY Bilateral    MENISCUS REPAIR Right    PARTIAL HYSTERECTOMY     Family History  Problem Relation Age of Onset   Colon cancer Mother    Heart attack Father    Congestive Heart Failure Brother    Other Brother    Deafness Brother    Heart disease Brother    Aneurysm Brother    Aneurysm Brother    Breast cancer Daughter     Social History   Tobacco Use   Smoking status: Never   Smokeless tobacco: Never  Substance Use Topics   Alcohol use: Never   Marital Status: Married  ROS  Review of Systems  Cardiovascular:  Negative for chest pain, dyspnea on exertion and leg swelling.   Objective  Blood pressure (!) 178/72, pulse 66, temperature 98 F  (36.7 C), temperature source Temporal, resp. rate 16, height '5\' 2"'$  (1.575 m), weight 173 lb 9.6 oz (78.7 kg), SpO2 96 %. Body mass index is 31.75 kg/m.     03/27/2022    9:25 AM 03/14/2022   10:44 AM 03/14/2022    7:30 AM  Vitals with BMI  Height '5\' 2"'$     Weight 173 lbs 10 oz    BMI 51.70    Systolic 017 494 496  Diastolic 72 66 83  Pulse 66  67     Physical Exam Neck:     Vascular: No carotid bruit or JVD.  Cardiovascular:     Rate and Rhythm: Normal rate and regular rhythm.     Pulses: Intact distal pulses.     Heart sounds: Normal heart sounds. No murmur heard.    No gallop.  Pulmonary:     Effort: Pulmonary effort is normal.     Breath sounds: Normal breath sounds.  Abdominal:     General: Bowel sounds are normal.     Palpations: Abdomen is soft.  Musculoskeletal:     Right lower leg: No edema.     Left lower leg: No edema.     Medications and allergies   Allergies  Allergen Reactions   Atorvastatin     Other reaction(s): myalgia   Pravastatin Sodium     Other reaction(s): myalgia  Rosuvastatin Calcium     Other reaction(s): myalgia   Zocor [Simvastatin]     Other reaction(s): myalgia   Codeine Rash     Medication list after today's encounter   Current Outpatient Medications:    albuterol (VENTOLIN HFA) 108 (90 Base) MCG/ACT inhaler, Inhale 2 puffs into the lungs every 6 (six) hours as needed for wheezing or shortness of breath., Disp: , Rfl:    Calcium-Magnesium (CALCIUM MAGNESIUM 750) 300-300 MG TABS, Take 1 tablet by mouth daily., Disp: , Rfl:    Cholecalciferol (VITAMIN D) 50 MCG (2000 UT) tablet, Take 2,000 Units by mouth daily., Disp: , Rfl:    losartan (COZAAR) 100 MG tablet, Take 100 mg by mouth daily., Disp: , Rfl: 11   meclizine (ANTIVERT) 25 MG tablet, Take 25 mg by mouth daily as needed for dizziness., Disp: , Rfl:    Multiple Vitamin (MULTIVITAMIN WITH MINERALS) TABS tablet, Take 1 tablet by mouth daily., Disp: , Rfl:    rosuvastatin (CRESTOR)  10 MG tablet, Take 10 mg by mouth every evening., Disp: , Rfl: 11   amLODipine (NORVASC) 10 MG tablet, Take 1 tablet (10 mg total) by mouth daily after breakfast., Disp: 90 tablet, Rfl: 3   metoprolol tartrate (LOPRESSOR) 25 MG tablet, Take 1 tablet (25 mg total) by mouth 2 (two) times daily., Disp: 180 tablet, Rfl: 3  Laboratory examination:   Lab Results  Component Value Date   NA 137 03/13/2022   K 4.0 03/13/2022   CO2 25 03/13/2022   GLUCOSE 117 (H) 03/13/2022   BUN 10 03/13/2022   CREATININE 0.74 03/13/2022   CALCIUM 9.5 03/13/2022   GFRNONAA >60 03/13/2022       Latest Ref Rng & Units 03/13/2022    5:40 AM 03/12/2022    1:22 PM  CMP  Glucose 70 - 99 mg/dL 117  131   BUN 8 - 23 mg/dL 10  7   Creatinine 0.44 - 1.00 mg/dL 0.74  0.78   Sodium 135 - 145 mmol/L 137  139   Potassium 3.5 - 5.1 mmol/L 4.0  4.0   Chloride 98 - 111 mmol/L 102  104   CO2 22 - 32 mmol/L 25  27   Calcium 8.9 - 10.3 mg/dL 9.5  9.8   Total Protein 6.5 - 8.1 g/dL 6.4    Total Bilirubin 0.3 - 1.2 mg/dL 0.6    Alkaline Phos 38 - 126 U/L 36    AST 15 - 41 U/L 23    ALT 0 - 44 U/L 19        Latest Ref Rng & Units 03/13/2022    5:40 AM 03/12/2022    1:22 PM  CBC  WBC 4.0 - 10.5 K/uL 8.4  5.9   Hemoglobin 12.0 - 15.0 g/dL 14.4  14.6   Hematocrit 36.0 - 46.0 % 41.3  42.4   Platelets 150 - 400 K/uL 288  298     External labs:   Labs 06/22/2021:  Total cholesterol 163, triglycerides 191, HDL 51, LDL 74.  TSH normal at 4.04.  Radiology:   CT angiogram of the chest and abdomen 03/12/2022: No evidence of thoracic aortic aneurysm or dissection.  Atherosclerotic calcification of the arch.  No evidence of central pulmonary embolism. Mild coronary atherosclerosis of the LAD. Mild lingular scarring/atelectasis. No evidence of renal artery stenosis. 4 mm subpleural nodule in the posterior right upper lobe likely benign.   Chest x-ray two-view 03/12/2022 The heart size and mediastinal contours are  within  normal limits. Both lungs are clear. The visualized skeletal structures are unremarkable.    Cardiac Studies:    Echocardiogram 09/27/2016: 1. Left ventricle cavity is normal in size. Normal global wall motion. Normal diastolic filling pattern, normal LAP. Calculated EF 65%. 2. Mild to moderate tricuspid regurgitation. Mild pulmonary hypertension. Pulmonary artery systolic pressure is estimated at 36 mm Hg.   Coronary CTA 03/13/2022: Total coronary calcium score of 75.1, places the patient at 36 percentile. LM: Normal caliber, mild 24 to 49% calcific plaque stenosis in the distal left main. LAD: Proximal LAD mild stenosis of 25 to 49% due to calcific plaque and mid and apical LAD is patent without significant disease.  D1 is large and normal. LCx: Normal, 2 large OM branches. RCA: Nondominant.  No significant disease. No significant extracardiac abnormality.  EKG:   EKG 03/27/2022: Normal sinus rhythm at rate of 59 bpm, normal axis, no evidence of ischemia.  Single PAC.  No significant change from 03/12/2022.    Assessment     ICD-10-CM   1. Primary hypertension  I10 EKG 12-Lead    amLODipine (NORVASC) 10 MG tablet    metoprolol tartrate (LOPRESSOR) 25 MG tablet    2. Atypical chest pain  R07.89     3. Mixed hyperlipidemia  E78.2        Orders Placed This Encounter  Procedures   EKG 12-Lead    Meds ordered this encounter  Medications   amLODipine (NORVASC) 10 MG tablet    Sig: Take 1 tablet (10 mg total) by mouth daily after breakfast.    Dispense:  90 tablet    Refill:  3    Sent refills to Dr. Roselle Locus   metoprolol tartrate (LOPRESSOR) 25 MG tablet    Sig: Take 1 tablet (25 mg total) by mouth 2 (two) times daily.    Dispense:  180 tablet    Refill:  3    Sent refills to Dr. Roselle Locus    Medications Discontinued During This Encounter  Medication Reason   metoprolol tartrate (LOPRESSOR) 25 MG tablet Reorder   amLODipine (NORVASC) 5 MG tablet Reorder      Recommendations:   Andrea Owen is a 81 y.o.  Patient presented to the emergency room on 03/12/2022 with chest pain, I did not think she was high risk from cardiac standpoint however patient was admitted overnight, due to nuclear camera being broken, she underwent coronary CTA which revealed only mild coronary calcification at low percentile and also minimal coronary atherosclerosis.    Fortunately she has not had any further episodes of chest pain.  Blood pressure continues to remain elevated which was noted in the emergency room and felt her chest pain was related to uncontrolled hypertension.  She is tolerating amlodipine 5 mg and also metoprolol 25 mg twice daily, will increase amlodipine to 10 mg.  She is presently on losartan 100 mg, if her blood pressure does not improve, she can be switched to olmesartan HCT 20/12.5 mg orally 140/25 mg in the morning and this can be done by her PCP on follow-up.  Otherwise from cardiac standpoint she is stable, lipids are controlled, external labs reviewed.  She has now started to exercise and walk on a daily basis, 10 pound weight loss also discussed with the patient.  I will see her back on a as needed basis.    Adrian Prows, MD, [facility] 03/27/2022, 10:19 AM Office: [phone]

## 2022-03-29 DIAGNOSIS — R079 Chest pain, unspecified: Secondary | ICD-10-CM | POA: Diagnosis not present

## 2022-03-29 DIAGNOSIS — Z09 Encounter for follow-up examination after completed treatment for conditions other than malignant neoplasm: Secondary | ICD-10-CM | POA: Diagnosis not present

## 2022-03-29 DIAGNOSIS — I10 Essential (primary) hypertension: Secondary | ICD-10-CM | POA: Diagnosis not present

## 2022-03-31 DIAGNOSIS — I10 Essential (primary) hypertension: Secondary | ICD-10-CM | POA: Diagnosis not present

## 2022-04-27 DIAGNOSIS — H9113 Presbycusis, bilateral: Secondary | ICD-10-CM | POA: Diagnosis not present

## 2022-05-01 DIAGNOSIS — H903 Sensorineural hearing loss, bilateral: Secondary | ICD-10-CM | POA: Diagnosis not present

## 2022-05-03 DIAGNOSIS — E782 Mixed hyperlipidemia: Secondary | ICD-10-CM | POA: Diagnosis not present

## 2022-05-03 DIAGNOSIS — I10 Essential (primary) hypertension: Secondary | ICD-10-CM | POA: Diagnosis not present

## 2022-05-03 DIAGNOSIS — E559 Vitamin D deficiency, unspecified: Secondary | ICD-10-CM | POA: Diagnosis not present

## 2022-06-29 DIAGNOSIS — R7303 Prediabetes: Secondary | ICD-10-CM | POA: Diagnosis not present

## 2022-06-29 DIAGNOSIS — I10 Essential (primary) hypertension: Secondary | ICD-10-CM | POA: Diagnosis not present

## 2022-06-29 DIAGNOSIS — R5383 Other fatigue: Secondary | ICD-10-CM | POA: Diagnosis not present

## 2022-06-29 DIAGNOSIS — Z0000 Encounter for general adult medical examination without abnormal findings: Secondary | ICD-10-CM | POA: Diagnosis not present

## 2022-06-29 DIAGNOSIS — E782 Mixed hyperlipidemia: Secondary | ICD-10-CM | POA: Diagnosis not present

## 2022-06-29 DIAGNOSIS — N182 Chronic kidney disease, stage 2 (mild): Secondary | ICD-10-CM | POA: Diagnosis not present

## 2022-06-29 DIAGNOSIS — R600 Localized edema: Secondary | ICD-10-CM | POA: Diagnosis not present

## 2022-07-06 DIAGNOSIS — H8109 Meniere's disease, unspecified ear: Secondary | ICD-10-CM | POA: Diagnosis not present

## 2022-07-06 DIAGNOSIS — N182 Chronic kidney disease, stage 2 (mild): Secondary | ICD-10-CM | POA: Diagnosis not present

## 2022-07-06 DIAGNOSIS — Z0000 Encounter for general adult medical examination without abnormal findings: Secondary | ICD-10-CM | POA: Diagnosis not present

## 2022-07-06 DIAGNOSIS — E782 Mixed hyperlipidemia: Secondary | ICD-10-CM | POA: Diagnosis not present

## 2022-07-06 DIAGNOSIS — I10 Essential (primary) hypertension: Secondary | ICD-10-CM | POA: Diagnosis not present

## 2022-07-06 DIAGNOSIS — R7303 Prediabetes: Secondary | ICD-10-CM | POA: Diagnosis not present

## 2022-07-06 DIAGNOSIS — R5383 Other fatigue: Secondary | ICD-10-CM | POA: Diagnosis not present

## 2022-07-06 DIAGNOSIS — R600 Localized edema: Secondary | ICD-10-CM | POA: Diagnosis not present

## 2022-12-18 DIAGNOSIS — H52203 Unspecified astigmatism, bilateral: Secondary | ICD-10-CM | POA: Diagnosis not present

## 2022-12-18 DIAGNOSIS — Z961 Presence of intraocular lens: Secondary | ICD-10-CM | POA: Diagnosis not present

## 2022-12-18 DIAGNOSIS — H26491 Other secondary cataract, right eye: Secondary | ICD-10-CM | POA: Diagnosis not present

## 2022-12-28 DIAGNOSIS — R7303 Prediabetes: Secondary | ICD-10-CM | POA: Diagnosis not present

## 2022-12-28 DIAGNOSIS — E782 Mixed hyperlipidemia: Secondary | ICD-10-CM | POA: Diagnosis not present

## 2022-12-28 DIAGNOSIS — I10 Essential (primary) hypertension: Secondary | ICD-10-CM | POA: Diagnosis not present

## 2023-01-04 DIAGNOSIS — H8109 Meniere's disease, unspecified ear: Secondary | ICD-10-CM | POA: Diagnosis not present

## 2023-01-04 DIAGNOSIS — R600 Localized edema: Secondary | ICD-10-CM | POA: Diagnosis not present

## 2023-01-04 DIAGNOSIS — R5383 Other fatigue: Secondary | ICD-10-CM | POA: Diagnosis not present

## 2023-01-04 DIAGNOSIS — N182 Chronic kidney disease, stage 2 (mild): Secondary | ICD-10-CM | POA: Diagnosis not present

## 2023-01-04 DIAGNOSIS — I10 Essential (primary) hypertension: Secondary | ICD-10-CM | POA: Diagnosis not present

## 2023-01-04 DIAGNOSIS — R079 Chest pain, unspecified: Secondary | ICD-10-CM | POA: Diagnosis not present

## 2023-01-04 DIAGNOSIS — E782 Mixed hyperlipidemia: Secondary | ICD-10-CM | POA: Diagnosis not present

## 2023-01-04 DIAGNOSIS — R7303 Prediabetes: Secondary | ICD-10-CM | POA: Diagnosis not present

## 2023-01-11 DIAGNOSIS — H26491 Other secondary cataract, right eye: Secondary | ICD-10-CM | POA: Diagnosis not present

## 2023-01-17 DIAGNOSIS — H43811 Vitreous degeneration, right eye: Secondary | ICD-10-CM | POA: Diagnosis not present

## 2023-01-17 DIAGNOSIS — Z961 Presence of intraocular lens: Secondary | ICD-10-CM | POA: Diagnosis not present

## 2023-03-30 ENCOUNTER — Other Ambulatory Visit: Payer: Self-pay | Admitting: Cardiology

## 2023-03-30 DIAGNOSIS — I10 Essential (primary) hypertension: Secondary | ICD-10-CM

## 2023-04-06 ENCOUNTER — Other Ambulatory Visit: Payer: Self-pay | Admitting: Cardiology

## 2023-04-06 DIAGNOSIS — I10 Essential (primary) hypertension: Secondary | ICD-10-CM

## 2023-07-05 DIAGNOSIS — Z0000 Encounter for general adult medical examination without abnormal findings: Secondary | ICD-10-CM | POA: Diagnosis not present

## 2023-07-05 DIAGNOSIS — I10 Essential (primary) hypertension: Secondary | ICD-10-CM | POA: Diagnosis not present

## 2023-07-05 DIAGNOSIS — E782 Mixed hyperlipidemia: Secondary | ICD-10-CM | POA: Diagnosis not present

## 2023-07-05 DIAGNOSIS — R5383 Other fatigue: Secondary | ICD-10-CM | POA: Diagnosis not present

## 2023-07-05 DIAGNOSIS — N182 Chronic kidney disease, stage 2 (mild): Secondary | ICD-10-CM | POA: Diagnosis not present

## 2023-07-05 DIAGNOSIS — R600 Localized edema: Secondary | ICD-10-CM | POA: Diagnosis not present

## 2023-07-05 DIAGNOSIS — H8109 Meniere's disease, unspecified ear: Secondary | ICD-10-CM | POA: Diagnosis not present

## 2023-07-05 DIAGNOSIS — R7303 Prediabetes: Secondary | ICD-10-CM | POA: Diagnosis not present

## 2023-08-29 DIAGNOSIS — E782 Mixed hyperlipidemia: Secondary | ICD-10-CM | POA: Diagnosis not present

## 2023-08-29 DIAGNOSIS — R7303 Prediabetes: Secondary | ICD-10-CM | POA: Diagnosis not present

## 2023-08-29 DIAGNOSIS — Z0000 Encounter for general adult medical examination without abnormal findings: Secondary | ICD-10-CM | POA: Diagnosis not present

## 2023-08-29 DIAGNOSIS — R5383 Other fatigue: Secondary | ICD-10-CM | POA: Diagnosis not present

## 2023-08-29 DIAGNOSIS — I10 Essential (primary) hypertension: Secondary | ICD-10-CM | POA: Diagnosis not present

## 2023-08-29 DIAGNOSIS — H8109 Meniere's disease, unspecified ear: Secondary | ICD-10-CM | POA: Diagnosis not present

## 2023-08-29 DIAGNOSIS — N182 Chronic kidney disease, stage 2 (mild): Secondary | ICD-10-CM | POA: Diagnosis not present

## 2023-08-29 DIAGNOSIS — R600 Localized edema: Secondary | ICD-10-CM | POA: Diagnosis not present

## 2024-01-16 DIAGNOSIS — H52203 Unspecified astigmatism, bilateral: Secondary | ICD-10-CM | POA: Diagnosis not present

## 2024-01-16 DIAGNOSIS — Z961 Presence of intraocular lens: Secondary | ICD-10-CM | POA: Diagnosis not present
